# Patient Record
Sex: Male | Born: 1975 | Race: Black or African American | Hispanic: No | Marital: Married | Smoking: Former smoker
Health system: Southern US, Community
[De-identification: ages and names within clinical notes are randomized; demographics above are authoritative.]

## PROBLEM LIST (undated history)

## (undated) DIAGNOSIS — I10 Essential (primary) hypertension: Secondary | ICD-10-CM

## (undated) DIAGNOSIS — I499 Cardiac arrhythmia, unspecified: Secondary | ICD-10-CM

## (undated) DIAGNOSIS — I4891 Unspecified atrial fibrillation: Secondary | ICD-10-CM

---

## 2013-04-02 ENCOUNTER — Emergency Department (HOSPITAL_BASED_OUTPATIENT_CLINIC_OR_DEPARTMENT_OTHER)
Admission: EM | Admit: 2013-04-02 | Discharge: 2013-04-02 | Disposition: A | Discharge status: Home / Self Care | Payer: Managed Care, Other (non HMO) | Attending: Emergency Medicine | Admitting: Emergency Medicine

## 2013-04-02 ENCOUNTER — Encounter (HOSPITAL_BASED_OUTPATIENT_CLINIC_OR_DEPARTMENT_OTHER): Payer: Self-pay | Admitting: Emergency Medicine

## 2013-04-02 ENCOUNTER — Emergency Department (HOSPITAL_BASED_OUTPATIENT_CLINIC_OR_DEPARTMENT_OTHER): Payer: Managed Care, Other (non HMO)

## 2013-04-02 DIAGNOSIS — M25519 Pain in unspecified shoulder: Secondary | ICD-10-CM | POA: Insufficient documentation

## 2013-04-02 DIAGNOSIS — R51 Headache: Secondary | ICD-10-CM | POA: Insufficient documentation

## 2013-04-02 DIAGNOSIS — R079 Chest pain, unspecified: Secondary | ICD-10-CM | POA: Insufficient documentation

## 2013-04-02 DIAGNOSIS — IMO0001 Reserved for inherently not codable concepts without codable children: Secondary | ICD-10-CM | POA: Insufficient documentation

## 2013-04-02 DIAGNOSIS — I10 Essential (primary) hypertension: Secondary | ICD-10-CM | POA: Insufficient documentation

## 2013-04-02 DIAGNOSIS — R0602 Shortness of breath: Secondary | ICD-10-CM | POA: Insufficient documentation

## 2013-04-02 DIAGNOSIS — F17200 Nicotine dependence, unspecified, uncomplicated: Secondary | ICD-10-CM | POA: Insufficient documentation

## 2013-04-02 DIAGNOSIS — R42 Dizziness and giddiness: Secondary | ICD-10-CM | POA: Insufficient documentation

## 2013-04-02 DIAGNOSIS — H9319 Tinnitus, unspecified ear: Secondary | ICD-10-CM | POA: Insufficient documentation

## 2013-04-02 HISTORY — DX: Cardiac arrhythmia, unspecified: I49.9

## 2013-04-02 HISTORY — DX: Essential (primary) hypertension: I10

## 2013-04-02 LAB — CBC WITH DIFFERENTIAL/PLATELET
Basophils Absolute: 0 10*3/uL (ref 0.0–0.1)
Basophils Relative: 0 % (ref 0–1)
EOS PCT: 5 % (ref 0–5)
Eosinophils Absolute: 0.3 10*3/uL (ref 0.0–0.7)
HCT: 44 % (ref 39.0–52.0)
HEMOGLOBIN: 14.5 g/dL (ref 13.0–17.0)
Lymphocytes Relative: 46 % (ref 12–46)
Lymphs Abs: 2.9 10*3/uL (ref 0.7–4.0)
MCH: 27.7 pg (ref 26.0–34.0)
MCHC: 33 g/dL (ref 30.0–36.0)
MCV: 84.1 fL (ref 78.0–100.0)
MONOS PCT: 7 % (ref 3–12)
Monocytes Absolute: 0.4 10*3/uL (ref 0.1–1.0)
Neutro Abs: 2.7 10*3/uL (ref 1.7–7.7)
Neutrophils Relative %: 42 % — ABNORMAL LOW (ref 43–77)
Platelets: 223 10*3/uL (ref 150–400)
RBC: 5.23 MIL/uL (ref 4.22–5.81)
RDW: 15.7 % — ABNORMAL HIGH (ref 11.5–15.5)
WBC: 6.3 10*3/uL (ref 4.0–10.5)

## 2013-04-02 LAB — COMPREHENSIVE METABOLIC PANEL
ALBUMIN: 4.5 g/dL (ref 3.5–5.2)
ALT: 16 U/L (ref 0–53)
AST: 21 U/L (ref 0–37)
Alkaline Phosphatase: 68 U/L (ref 39–117)
BILIRUBIN TOTAL: 0.6 mg/dL (ref 0.3–1.2)
BUN: 11 mg/dL (ref 6–23)
CALCIUM: 10.4 mg/dL (ref 8.4–10.5)
CHLORIDE: 104 meq/L (ref 96–112)
CO2: 27 mEq/L (ref 19–32)
Creatinine, Ser: 1.1 mg/dL (ref 0.50–1.35)
GFR calc Af Amer: >90 mL/min (ref >90)
GFR calc non Af Amer: 83 mL/min — ABNORMAL LOW (ref >90)
Glucose, Bld: 86 mg/dL (ref 70–99)
Potassium: 4.3 mEq/L (ref 3.7–5.3)
Sodium: 144 mEq/L (ref 137–147)
Total Protein: 8.1 g/dL (ref 6.0–8.3)

## 2013-04-02 LAB — D-DIMER, QUANTITATIVE: D-Dimer, Quant: <0.27 ug/mL-FEU (ref 0.00–0.48)

## 2013-04-02 LAB — TROPONIN I: Troponin I: <0.3 ng/mL (ref <0.30)

## 2013-04-02 NOTE — Discharge Instructions (Signed)

## 2013-04-02 NOTE — ED Notes (Signed)
Pt denies medical hx on interview. Wife states he is not being forthcoming. He has a hx of HTN and irregular heartbeat. He was also taking Coumadin but stopped all his medications over a year ago.

## 2013-04-02 NOTE — ED Provider Notes (Signed)
CSN: [id number]     Arrival date & time 04/02/13  1155 History   First MD Initiated Contact with Patient 04/02/13 1427     Chief Complaint  Patient presents with  . Dizziness   (Consider location/radiation/quality/duration/timing/severity/associated sxs/prior Treatment) Patient is a 38 y.o. male presenting with dizziness. The history is provided by the patient.  Dizziness Quality:  Head spinning and imbalance Severity:  Severe Onset quality:  Gradual Duration:  4 days Timing:  Intermittent Progression:  Worsening Chronicity:  New Relieved by:  Lying down Worsened by:  Movement Ineffective treatments:  None tried Associated symptoms: chest pain, headaches, shortness of breath and tinnitus   Associated symptoms: no blood in stool, no diarrhea, no hearing loss, no nausea, no palpitations, no syncope, no vision changes and no vomiting   Risk factors: no anemia, no hx of stroke, no hx of vertigo and no new medications    Aaron Mccoy is a 38 y.o. male who presents to the ED with feeling dizzy x 4 days. He states that he went and had his BP taken yesterday at work and told it was high. He reports that he has had HTN in the past and was on medication but stopped the medication over a year ago. He has had head aches x 4 days as well as chest pain. He has a history of irregular heart beat and has been on blood thinner for heart problems but stopped them a year ago as well. He complains of headache with the dizziness.   Past Medical History  Diagnosis Date  . Hypertension   . Irregular heartbeat    History reviewed. No pertinent past surgical history. No family history on file. History  Substance Use Topics  . Smoking status: Current Every Day Smoker -- 0.50 packs/day    Types: Cigarettes  . Smokeless tobacco: Not on file  . Alcohol Use: No    Review of Systems  Constitutional: Negative for fever and chills.  HENT: Positive for tinnitus. Negative for hearing loss.   Eyes:  Negative for visual disturbance.  Respiratory: Positive for shortness of breath.   Cardiovascular: Positive for chest pain. Negative for palpitations and syncope.  Gastrointestinal: Negative for nausea, vomiting, diarrhea and blood in stool.  Genitourinary: Negative for dysuria and frequency.  Musculoskeletal: Positive for myalgias. Negative for back pain and neck stiffness.       Right shoulder pain  Skin: Negative for rash.  Neurological: Positive for dizziness and headaches. Negative for seizures and weakness.  Psychiatric/Behavioral: Negative for confusion. The patient is not nervous/anxious.     Allergies  Review of patient's allergies indicates no known allergies.  Home Medications  No current outpatient prescriptions on file. BP 136/85  Pulse 75  Temp(Src) 98.2 F (36.8 C) (Oral)  Resp 18  Ht 5' 8"  (1.727 m)  Wt 215 lb (97.523 kg)  BMI 32.70 kg/m2  SpO2 100% Physical Exam  Nursing note and vitals reviewed. Constitutional: He is oriented to person, place, and time. He appears well-developed and well-nourished.  HENT:  Head: Normocephalic and atraumatic.  Right Ear: Tympanic membrane normal.  Left Ear: Tympanic membrane normal.  Mouth/Throat: Oropharynx is clear and moist and mucous membranes are normal.  Eyes: Conjunctivae and EOM are normal.  Neck: Normal range of motion. Neck supple.  Cardiovascular: Normal rate.   Pulmonary/Chest: Effort normal. He has no wheezes. He has no rales.  Abdominal: Soft. There is no tenderness.  Musculoskeletal: Normal range of motion.  Right shoulder: He exhibits tenderness. He exhibits normal range of motion, no swelling, no deformity, no laceration, no spasm, normal pulse and normal strength.       Arms: Neurological: He is alert and oriented to person, place, and time. He has normal strength and normal reflexes. No cranial nerve deficit or sensory deficit. Gait normal.  Skin: Skin is warm and dry.  Psychiatric: He has a normal  mood and affect. His behavior is normal.    ED Course  Procedures Results for orders placed during the hospital encounter of 04/02/13 (from the past 24 hour(s))  COMPREHENSIVE METABOLIC PANEL     Status: Abnormal   Collection Time    04/02/13  3:20 PM      Result Value Range   Sodium 144  137 - 147 mEq/L   Potassium 4.3  3.7 - 5.3 mEq/L   Chloride 104  96 - 112 mEq/L   CO2 27  19 - 32 mEq/L   Glucose, Bld 86  70 - 99 mg/dL   BUN 11  6 - 23 mg/dL   Creatinine, Ser 1.10  0.50 - 1.35 mg/dL   Calcium 10.4  8.4 - 10.5 mg/dL   Total Protein 8.1  6.0 - 8.3 g/dL   Albumin 4.5  3.5 - 5.2 g/dL   AST 21  0 - 37 U/L   ALT 16  0 - 53 U/L   Alkaline Phosphatase 68  39 - 117 U/L   Total Bilirubin 0.6  0.3 - 1.2 mg/dL   GFR calc non Af Amer 83 (*) >90 mL/min   GFR calc Af Amer >90  >90 mL/min  CBC WITH DIFFERENTIAL     Status: Abnormal   Collection Time    04/02/13  3:20 PM      Result Value Range   WBC 6.3  4.0 - 10.5 K/uL   RBC 5.23  4.22 - 5.81 MIL/uL   Hemoglobin 14.5  13.0 - 17.0 g/dL   HCT 44.0  39.0 - 52.0 %   MCV 84.1  78.0 - 100.0 fL   MCH 27.7  26.0 - 34.0 pg   MCHC 33.0  30.0 - 36.0 g/dL   RDW 15.7 (*) 11.5 - 15.5 %   Platelets 223  150 - 400 K/uL   Neutrophils Relative % 42 (*) 43 - 77 %   Neutro Abs 2.7  1.7 - 7.7 K/uL   Lymphocytes Relative 46  12 - 46 %   Lymphs Abs 2.9  0.7 - 4.0 K/uL   Monocytes Relative 7  3 - 12 %   Monocytes Absolute 0.4  0.1 - 1.0 K/uL   Eosinophils Relative 5  0 - 5 %   Eosinophils Absolute 0.3  0.0 - 0.7 K/uL   Basophils Relative 0  0 - 1 %   Basophils Absolute 0.0  0.0 - 0.1 K/uL  TROPONIN I     Status: None   Collection Time    04/02/13  3:20 PM      Result Value Range   Troponin I <0.30  <0.30 ng/mL  D-DIMER, QUANTITATIVE     Status: None   Collection Time    04/02/13  3:20 PM      Result Value Range   D-Dimer, Quant <0.27  0.00 - 0.48 ug/mL-FEU    Imaging Review Dg Chest 2 View  04/02/2013   CLINICAL DATA:  Right-sided chest  pain for 4 days  EXAM: CHEST  2 VIEW  COMPARISON:  None  available  FINDINGS: The heart size and mediastinal contours are within normal limits. Both lungs are clear. The visualized skeletal structures are unremarkable.  IMPRESSION: No active cardiopulmonary disease.   Electronically Signed   By: Skipper Cliche M.D.   On: 04/02/2013 15:12    EKG Interpretation    Date/Time:  Thursday April 02 2013 12:19:56 EST Ventricular Rate:  77 PR Interval:  160 QRS Duration: 82 QT Interval:  342 QTC Calculation: 387 R Axis:   69 Text Interpretation:  Normal sinus rhythm with sinus arrhythmia Nonspecific T wave abnormality Abnormal ECG No previous ECGs available Confirmed by Wyvonnia Dusky  MD, STEPHEN ([id number]) on 04/02/2013 12:22:42 PM            MDM: Dr. Wyvonnia Dusky in to examine the patient and review labs and x-ray.   38 y.o. male with complaint of dizziness and right shoulder pain.I have reviewed this patient's vital signs, nurses notes, appropriate labs and imaging.  I have discussed findings with the patient and plan of care. He voices understanding. He has an appointment with his PCP. He will keep this appointment. He will return here for worsening symptoms. Stable for discharge without any immediate complications. No signs of PE, MI or CVA at this time.   [place], [place] 04/03/13 1446

## 2013-04-02 NOTE — ED Notes (Signed)
Lightheaded and dizzy x 4 days. Right shoulder pain. States he has a hx of tendinitis.

## 2013-04-03 NOTE — ED Provider Notes (Signed)
Medical screening examination/treatment/procedure(s) were conducted as a shared visit with non-physician practitioner(s) and myself.  I personally evaluated the patient during the encounter.  Poor historian regarding current symptoms as well as PMH. 4 day history of vague lightheadedness, fatigue, intermittent headaches, and R sided chest pain. Hx HTN and possible atrial fibrillation, stopped all meds 1 year ago.  Was on xarelto and coumadin previously. CN 2-12 intact, no ataxia on finger to nose, no nystagmus, 5/5 strength throughout, no pronator drift, Romberg negative, normal gait. NSR on EKG, nonfocal neuro exam, no headache currently. BP mildly elevated. BP 148/92  Pulse 79  Temp(Src) 98.3 F (36.8 C) (Oral)  Resp 20  Ht 5' 8"  (1.727 m)  Wt 215 lb (97.523 kg)  BMI 32.70 kg/m2  SpO2 99%    EKG Interpretation    Date/Time:  Thursday April 02 2013 12:19:56 EST Ventricular Rate:  77 PR Interval:  160 QRS Duration: 82 QT Interval:  342 QTC Calculation: 387 R Axis:   69 Text Interpretation:  Normal sinus rhythm with sinus arrhythmia Nonspecific T wave abnormality Abnormal ECG No previous ECGs available Confirmed by Wyvonnia Dusky  MD, [place] ([id number]) on 04/02/2013 12:22:42 PM              Ezequiel Essex, MD 04/03/13 2355

## 2014-03-27 ENCOUNTER — Emergency Department (HOSPITAL_COMMUNITY): Payer: No Typology Code available for payment source

## 2014-03-27 ENCOUNTER — Emergency Department (HOSPITAL_COMMUNITY)
Admission: EM | Admit: 2014-03-27 | Discharge: 2014-03-27 | Disposition: A | Discharge status: Home / Self Care | Payer: No Typology Code available for payment source | Attending: Emergency Medicine | Admitting: Emergency Medicine

## 2014-03-27 ENCOUNTER — Encounter (HOSPITAL_COMMUNITY): Payer: Self-pay | Admitting: Emergency Medicine

## 2014-03-27 DIAGNOSIS — Y92410 Unspecified street and highway as the place of occurrence of the external cause: Secondary | ICD-10-CM | POA: Diagnosis not present

## 2014-03-27 DIAGNOSIS — Y998 Other external cause status: Secondary | ICD-10-CM | POA: Insufficient documentation

## 2014-03-27 DIAGNOSIS — Y9389 Activity, other specified: Secondary | ICD-10-CM | POA: Insufficient documentation

## 2014-03-27 DIAGNOSIS — Z720 Tobacco use: Secondary | ICD-10-CM | POA: Insufficient documentation

## 2014-03-27 DIAGNOSIS — T148XXA Other injury of unspecified body region, initial encounter: Secondary | ICD-10-CM

## 2014-03-27 DIAGNOSIS — S8992XA Unspecified injury of left lower leg, initial encounter: Secondary | ICD-10-CM | POA: Diagnosis present

## 2014-03-27 DIAGNOSIS — S80212A Abrasion, left knee, initial encounter: Secondary | ICD-10-CM | POA: Insufficient documentation

## 2014-03-27 DIAGNOSIS — I10 Essential (primary) hypertension: Secondary | ICD-10-CM | POA: Insufficient documentation

## 2014-03-27 DIAGNOSIS — M25562 Pain in left knee: Secondary | ICD-10-CM

## 2014-03-27 MED ORDER — NAPROXEN 500 MG PO TABS: 500.0000 mg | ORAL_TABLET | Freq: Two times a day (BID) | ORAL | Status: DC
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
## 2014-09-23 ENCOUNTER — Emergency Department (HOSPITAL_COMMUNITY)
Admission: EM | Admit: 2014-09-23 | Discharge: 2014-09-23 | Disposition: A | Discharge status: Home / Self Care | Payer: Managed Care, Other (non HMO) | Attending: Emergency Medicine | Admitting: Emergency Medicine

## 2014-09-23 ENCOUNTER — Encounter (HOSPITAL_COMMUNITY): Payer: Self-pay | Admitting: Emergency Medicine

## 2014-09-23 DIAGNOSIS — Z8679 Personal history of other diseases of the circulatory system: Secondary | ICD-10-CM | POA: Insufficient documentation

## 2014-09-23 DIAGNOSIS — Z720 Tobacco use: Secondary | ICD-10-CM | POA: Insufficient documentation

## 2014-09-23 DIAGNOSIS — I10 Essential (primary) hypertension: Secondary | ICD-10-CM | POA: Insufficient documentation

## 2014-09-23 DIAGNOSIS — Y9389 Activity, other specified: Secondary | ICD-10-CM | POA: Insufficient documentation

## 2014-09-23 DIAGNOSIS — M545 Low back pain, unspecified: Secondary | ICD-10-CM

## 2014-09-23 DIAGNOSIS — S3992XA Unspecified injury of lower back, initial encounter: Secondary | ICD-10-CM | POA: Insufficient documentation

## 2014-09-23 DIAGNOSIS — Y9289 Other specified places as the place of occurrence of the external cause: Secondary | ICD-10-CM | POA: Insufficient documentation

## 2014-09-23 DIAGNOSIS — W01198A Fall on same level from slipping, tripping and stumbling with subsequent striking against other object, initial encounter: Secondary | ICD-10-CM | POA: Insufficient documentation

## 2014-09-23 DIAGNOSIS — Y998 Other external cause status: Secondary | ICD-10-CM | POA: Insufficient documentation

## 2014-09-23 HISTORY — DX: Unspecified atrial fibrillation: I48.91

## 2014-09-23 MED ORDER — NAPROXEN 250 MG PO TABS: 250.0000 mg | ORAL_TABLET | Freq: Two times a day (BID) | ORAL | Status: DC

## 2014-09-23 MED ORDER — METHOCARBAMOL 500 MG PO TABS: 500.0000 mg | ORAL_TABLET | Freq: Two times a day (BID) | ORAL | Status: DC | PRN

## 2014-09-23 NOTE — ED Provider Notes (Signed)
CSN: [id number]     Arrival date & time 09/23/14  0814 History  This chart was scribed for non-physician practitioner, Waynetta Pean, PA-C, working with Nat Christen, MD by Ladene Artist, ED Scribe. This patient was seen in room TR07C/TR07C and the patient's care was started at 9:05 AM.   Chief Complaint  Patient presents with  . Fall   The history is provided by the patient. No language interpreter was used.   HPI Comments: Aaron Mccoy is a 39 y.o. male who presents to the Emergency Department complaining of a slip and fall that occurred 5 days ago. Pt states that he slipped on wet wooden steps 5 days ago and has experienced persistent, 10/10 left lower back pain since. No head trauma or LOC.  Pt reports that pain is worse with movement. He has been treating with ibuprofen without significant relief; last dose was last night. Pt denies numbness/tingling, weakness, urinary or bowel incontinence, abdominal pain, nausea, vomiting, dysuria, hematuria, chest pain, SOB. He also denies h/o back pain, CA or IV drug use.  PCP: Jeralene Huff   Past Medical History  Diagnosis Date  . Hypertension   . Irregular heartbeat   . A-fib    History reviewed. No pertinent past surgical history. No family history on file. History  Substance Use Topics  . Smoking status: Current Every Day Smoker -- 1.00 packs/day    Types: Cigarettes  . Smokeless tobacco: Not on file  . Alcohol Use: No    Review of Systems  Constitutional: Negative for fever and chills.  Respiratory: Negative for shortness of breath.   Cardiovascular: Negative for chest pain.  Gastrointestinal: Negative for nausea, vomiting and abdominal pain.  Genitourinary: Negative for dysuria, hematuria and difficulty urinating.  Musculoskeletal: Positive for back pain. Negative for gait problem, neck pain and neck stiffness.  Skin: Negative for rash and wound.  Neurological: Negative for weakness, light-headedness and numbness.   Allergies   Review of patient's allergies indicates no known allergies.  Home Medications   Prior to Admission medications   Medication Sig Start Date End Date Taking? Authorizing Provider  methocarbamol (ROBAXIN) 500 MG tablet Take 1 tablet (500 mg total) by mouth 2 (two) times daily as needed for muscle spasms. 09/23/14   Waynetta Pean, PA-C  naproxen (NAPROSYN) 250 MG tablet Take 1 tablet (250 mg total) by mouth 2 (two) times daily with a meal. 09/23/14   Waynetta Pean, PA-C   BP 141/97 mmHg  Pulse 75  Temp(Src) 98.4 F (36.9 C) (Oral)  Resp 16  SpO2 100% Physical Exam  Constitutional: He is oriented to person, place, and time. He appears well-developed and well-nourished. No distress.  Nontoxic appearing.  HENT:  Head: Normocephalic and atraumatic.  Eyes: Right eye exhibits no discharge. Left eye exhibits no discharge.  Neck: Normal range of motion. Neck supple.  Cardiovascular: Normal rate, regular rhythm and intact distal pulses.   Pulses:      Dorsalis pedis pulses are 2+ on the right side, and 2+ on the left side.       Posterior tibial pulses are 2+ on the right side, and 2+ on the left side.  Pulmonary/Chest: Effort normal. No respiratory distress.  Musculoskeletal: Normal range of motion. He exhibits tenderness. He exhibits no edema.  Mild L lateral lower back tenderness to palpation. No evidence of trauma. No midline spine tenderness. No back erythema, echymosis, deformity or edema. No LE edema or tenderness. Sensation intact in bilateral LE. The patient is able  to ambulate without difficulty or assistance. Patient has 5/5 strength in his bilateral lower extremities.  Neurological: He is alert and oriented to person, place, and time. He has normal reflexes. He displays normal reflexes. Coordination normal.  Sensation is intact his bilateral lower extremities. Bilateral patellar DTRs are intact.  Skin: Skin is warm and dry. No rash noted. He is not diaphoretic. No erythema. No pallor.   Psychiatric: He has a normal mood and affect. His behavior is normal.  Nursing note and vitals reviewed.  ED Course  Procedures (including critical care time) DIAGNOSTIC STUDIES: Oxygen Saturation is 100% on RA, normal by my interpretation.    COORDINATION OF CARE: 9:11 AM-Discussed treatment plan which includes Naproxen with pt at bedside and pt agreed to plan.   Labs Review Labs Reviewed - No data to display  Imaging Review No results found.   EKG Interpretation None      Filed Vitals:   09/23/14 0904  BP: 141/97  Pulse: 75  Temp: 98.4 F (36.9 C)  TempSrc: Oral  Resp: 16  SpO2: 100%     MDM   Meds given in ED:  Medications - No data to display  New Prescriptions   METHOCARBAMOL (ROBAXIN) 500 MG TABLET    Take 1 tablet (500 mg total) by mouth 2 (two) times daily as needed for muscle spasms.   NAPROXEN (NAPROSYN) 250 MG TABLET    Take 1 tablet (250 mg total) by mouth 2 (two) times daily with a meal.    Final diagnoses:  Left-sided low back pain without sciatica   Patient with left back pain after a trip and fall 5 days ago.  No neurological deficits and normal neuro exam.  Patient can walk but states is painful.  No loss of bowel or bladder control.  No concern for cauda equina.  No fever, night sweats, weight loss, h/o cancer, IVDU.  RICE protocol and pain medicine indicated and discussed with patient. I advised the patient to follow-up with their primary care provider this week. I advised the patient to return to the emergency department with new or worsening symptoms or new concerns. The patient verbalized understanding and agreement with plan.    I personally performed the services described in this documentation, which was scribed in my presence. The recorded information has been reviewed and is accurate.    Waynetta Pean, PA-C 09/23/14 2820  Nat Christen, MD 09/24/14 1344

## 2014-09-23 NOTE — Discharge Instructions (Signed)
Back Exercises Back exercises help treat and prevent back injuries. The goal of back exercises is to increase the strength of your abdominal and back muscles and the flexibility of your back. These exercises should be started when you no longer have back pain. Back exercises include:  Pelvic Tilt. Lie on your back with your knees bent. Tilt your pelvis until the lower part of your back is against the floor. Hold this position 5 to 10 sec and repeat 5 to 10 times.  Knee to Chest. Pull first 1 knee up against your chest and hold for 20 to 30 seconds, repeat this with the other knee, and then both knees. This may be done with the other leg straight or bent, whichever feels better.  Sit-Ups or Curl-Ups. Bend your knees 90 degrees. Start with tilting your pelvis, and do a partial, slow sit-up, lifting your trunk only 30 to 45 degrees off the floor. Take at least 2 to 3 seconds for each sit-up. Do not do sit-ups with your knees out straight. If partial sit-ups are difficult, simply do the above but with only tightening your abdominal muscles and holding it as directed.  Hip-Lift. Lie on your back with your knees flexed 90 degrees. Push down with your feet and shoulders as you raise your hips a couple inches off the floor; hold for 10 seconds, repeat 5 to 10 times.  Back arches. Lie on your stomach, propping yourself up on bent elbows. Slowly press on your hands, causing an arch in your low back. Repeat 3 to 5 times. Any initial stiffness and discomfort should lessen with repetition over time.  Shoulder-Lifts. Lie face down with arms beside your body. Keep hips and torso pressed to floor as you slowly lift your head and shoulders off the floor. Do not overdo your exercises, especially in the beginning. Exercises may cause you some mild back discomfort which lasts for a few minutes; however, if the pain is more severe, or lasts for more than 15 minutes, do not continue exercises until you see your caregiver.  Improvement with exercise therapy for back problems is slow.  See your caregivers for assistance with developing a proper back exercise program. Document Released: 03/29/2004 Document Revised: 05/14/2011 Document Reviewed: 12/21/2010 [facility] Patient Information 2015 [place], [place]. This information is not intended to replace advice given to you by your health care provider. Make sure you discuss any questions you have with your health care provider. Back Pain, Adult Low back pain is very common. About 1 in 5 people have back pain.The cause of low back pain is rarely dangerous. The pain often gets better over time.About half of people with a sudden onset of back pain feel better in just 2 weeks. About 8 in 10 people feel better by 6 weeks.  CAUSES Some common causes of back pain include:  Strain of the muscles or ligaments supporting the spine.  Wear and tear (degeneration) of the spinal discs.  Arthritis.  Direct injury to the back. DIAGNOSIS Most of the time, the direct cause of low back pain is not known.However, back pain can be treated effectively even when the exact cause of the pain is unknown.Answering your caregiver's questions about your overall health and symptoms is one of the most accurate ways to make sure the cause of your pain is not dangerous. If your caregiver needs more information, he or she may order lab work or imaging tests (X-rays or MRIs).However, even if imaging tests show changes in your back,  this usually does not require surgery. HOME CARE INSTRUCTIONS For many people, back pain returns.Since low back pain is rarely dangerous, it is often a condition that people can learn to [facility] their own.   Remain active. It is stressful on the back to sit or stand in one place. Do not sit, drive, or stand in one place for more than 30 minutes at a time. Take short walks on level surfaces as soon as pain allows.Try to increase the length of time you walk each  day.  Do not stay in bed.Resting more than 1 or 2 days can delay your recovery.  Do not avoid exercise or work.Your body is made to move.It is not dangerous to be active, even though your back may hurt.Your back will likely heal faster if you return to being active before your pain is gone.  Pay attention to your body when you bend and lift. Many people have less discomfortwhen lifting if they bend their knees, keep the load close to their bodies,and avoid twisting. Often, the most comfortable positions are those that put less stress on your recovering back.  Find a comfortable position to sleep. Use a firm mattress and lie on your side with your knees slightly bent. If you lie on your back, put a pillow under your knees.  Only take over-the-counter or prescription medicines as directed by your caregiver. Over-the-counter medicines to reduce pain and inflammation are often the most helpful.Your caregiver may prescribe muscle relaxant drugs.These medicines help dull your pain so you can more quickly return to your normal activities and healthy exercise.  Put ice on the injured area.  Put ice in a plastic bag.  Place a towel between your skin and the bag.  Leave the ice on for 15-20 minutes, 03-04 times a day for the first 2 to 3 days. After that, ice and heat may be alternated to reduce pain and spasms.  Ask your caregiver about trying back exercises and gentle massage. This may be of some benefit.  Avoid feeling anxious or stressed.Stress increases muscle tension and can worsen back pain.It is important to recognize when you are anxious or stressed and learn ways to manage it.Exercise is a great option. SEEK MEDICAL CARE IF:  You have pain that is not relieved with rest or medicine.  You have pain that does not improve in 1 week.  You have new symptoms.  You are generally not feeling well. SEEK IMMEDIATE MEDICAL CARE IF:   You have pain that radiates from your back into  your legs.  You develop new bowel or bladder control problems.  You have unusual weakness or numbness in your arms or legs.  You develop nausea or vomiting.  You develop abdominal pain.  You feel faint. Document Released: 02/19/2005 Document Revised: 08/21/2011 Document Reviewed: 06/23/2013 [facility] Patient Information 2015 [place], [place]. This information is not intended to replace advice given to you by your health care provider. Make sure you discuss any questions you have with your health care provider.

## 2014-09-23 NOTE — ED Notes (Signed)
Declined W/C at D/C and was escorted to lobby by RN. 

## 2014-09-23 NOTE — ED Notes (Signed)
Patient states slipped on some wood steps on Saturday and now having low back pain.  Patient states has been using ibuprofen and ben gay since the fall, but still having pain.

## 2015-02-23 ENCOUNTER — Encounter (HOSPITAL_COMMUNITY): Payer: Self-pay | Admitting: *Deleted

## 2015-02-23 ENCOUNTER — Emergency Department (HOSPITAL_COMMUNITY)
Admission: EM | Admit: 2015-02-23 | Discharge: 2015-02-23 | Disposition: A | Discharge status: Home / Self Care | Payer: Managed Care, Other (non HMO) | Attending: Emergency Medicine | Admitting: Emergency Medicine

## 2015-02-23 DIAGNOSIS — J111 Influenza due to unidentified influenza virus with other respiratory manifestations: Secondary | ICD-10-CM

## 2015-02-23 DIAGNOSIS — Z791 Long term (current) use of non-steroidal anti-inflammatories (NSAID): Secondary | ICD-10-CM | POA: Insufficient documentation

## 2015-02-23 DIAGNOSIS — F17210 Nicotine dependence, cigarettes, uncomplicated: Secondary | ICD-10-CM | POA: Diagnosis not present

## 2015-02-23 DIAGNOSIS — I10 Essential (primary) hypertension: Secondary | ICD-10-CM | POA: Insufficient documentation

## 2015-02-23 DIAGNOSIS — R5383 Other fatigue: Secondary | ICD-10-CM | POA: Diagnosis present

## 2015-02-23 DIAGNOSIS — R69 Illness, unspecified: Secondary | ICD-10-CM

## 2015-02-23 MED ORDER — BENZONATATE 100 MG PO CAPS: 100.0000 mg | ORAL_CAPSULE | Freq: Three times a day (TID) | ORAL | Status: DC

## 2015-02-23 MED ORDER — IBUPROFEN 800 MG PO TABS: 800.0000 mg | ORAL_TABLET | Freq: Once | ORAL | Status: DC

## 2015-02-23 MED ORDER — ACETAMINOPHEN 500 MG PO TABS: 1000.0000 mg | ORAL_TABLET | Freq: Once | ORAL | Status: DC

## 2015-02-23 NOTE — ED Notes (Signed)
Pt arrives from home with c/o fatigue and generalized body aches. Pt denies cough, fever or chills.

## 2015-02-23 NOTE — ED Provider Notes (Signed)
CSN: [id number]     Arrival date & time 02/23/15  0913 History   First MD Initiated Contact with Patient 02/23/15 0915     Chief Complaint  Patient presents with  . Fatigue     (Consider location/radiation/quality/duration/timing/severity/associated sxs/prior Treatment) Patient is a 39 y.o. male presenting with general illness. The history is provided by the patient.  Illness Severity:  Moderate Onset quality:  Gradual Duration:  2 days Timing:  Constant Progression:  Worsening Chronicity:  New Associated symptoms: congestion, cough, fatigue, fever and myalgias   Associated symptoms: no abdominal pain, no chest pain, no diarrhea, no headaches, no rash, no shortness of breath and no vomiting     39 yo M with a chief complaint of cough congestion subjective fevers and chills. This been going on for the past couple days. Patient is felt really weak at home today. Denies shortness breath denies chest pain denies abdominal pain nausea vomiting or diarrhea. Coworkers have been sick.  Past Medical History  Diagnosis Date  . Hypertension   . Irregular heartbeat   . A-fib [facility])    History reviewed. No pertinent past surgical history. History reviewed. No pertinent family history. Social History  Substance Use Topics  . Smoking status: Current Every Day Smoker -- 1.00 packs/day    Types: Cigarettes  . Smokeless tobacco: None  . Alcohol Use: No    Review of Systems  Constitutional: Positive for fever and fatigue. Negative for chills.  HENT: Positive for congestion. Negative for facial swelling.   Eyes: Negative for discharge and visual disturbance.  Respiratory: Positive for cough. Negative for shortness of breath.   Cardiovascular: Negative for chest pain and palpitations.  Gastrointestinal: Negative for vomiting, abdominal pain and diarrhea.  Musculoskeletal: Positive for myalgias. Negative for arthralgias.  Skin: Negative for color change and rash.  Neurological: Negative  for tremors, syncope and headaches.  Psychiatric/Behavioral: Negative for confusion and dysphoric mood.      Allergies  Review of patient's allergies indicates no known allergies.  Home Medications   Prior to Admission medications   Medication Sig Start Date End Date Taking? Authorizing Provider  naproxen (NAPROSYN) 250 MG tablet Take 1 tablet (250 mg total) by mouth 2 (two) times daily with a meal. 09/23/14  Yes Waynetta Pean, PA-C  benzonatate (TESSALON) 100 MG capsule Take 1 capsule (100 mg total) by mouth every 8 (eight) hours. 02/23/15   Deno Etienne, DO  methocarbamol (ROBAXIN) 500 MG tablet Take 1 tablet (500 mg total) by mouth 2 (two) times daily as needed for muscle spasms. 09/23/14   Waynetta Pean, PA-C   BP 140/102 mmHg  Pulse 79  Temp(Src) 98.9 F (37.2 C) (Oral)  Resp 18  SpO2 99% Physical Exam  Constitutional: He is oriented to person, place, and time. He appears well-developed and well-nourished.  HENT:  Head: Normocephalic and atraumatic.  Swollen turbinates  Eyes: EOM are normal. Pupils are equal, round, and reactive to light.  Neck: Normal range of motion. Neck supple. No JVD present.  Cardiovascular: Normal rate and regular rhythm.  Exam reveals no gallop and no friction rub.   No murmur heard. Pulmonary/Chest: No respiratory distress. He has no wheezes. He has no rales.  Abdominal: He exhibits no distension. There is no rebound and no guarding.  Musculoskeletal: Normal range of motion.  Neurological: He is alert and oriented to person, place, and time.  Skin: No rash noted. No pallor.  Psychiatric: He has a normal mood and affect. His behavior is  normal.  Nursing note and vitals reviewed.   ED Course  Procedures (including critical care time) Labs Review Labs Reviewed - No data to display  Imaging Review No results found. I have personally reviewed and evaluated these images and lab results as part of my medical decision-making.   EKG  Interpretation None      MDM   Final diagnoses:  Influenza-like illness    39 yo M with a viral-like syndrome. Patient is well-appearing and nontoxic clear breath sounds in all lung fields feel no need for x-ray at this time. Will start on NSAIDs Tylenol he can follow-up with his family doctor.  9:33 AM:  I have discussed the diagnosis/risks/treatment options with the patient and believe the pt to be eligible for discharge home to follow-up with PCP. We also discussed returning to the ED immediately if new or worsening sx occur. We discussed the sx which are most concerning (e.g., sudden worsening pain, fever, inability to tolerate by mouth) that necessitate immediate return. Medications administered to the patient during their visit and any new prescriptions provided to the patient are listed below.  Medications given during this visit Medications  acetaminophen (TYLENOL) tablet 1,000 mg (not administered)  ibuprofen (ADVIL,MOTRIN) tablet 800 mg (not administered)    New Prescriptions   BENZONATATE (TESSALON) 100 MG CAPSULE    Take 1 capsule (100 mg total) by mouth every 8 (eight) hours.    The patient appears reasonably screen and/or stabilized for discharge and I doubt any other medical condition or other [facility] requiring further screening, evaluation, or treatment in the ED at this time prior to discharge.      Deno Etienne, DO 02/23/15 [phone]

## 2015-02-23 NOTE — Discharge Instructions (Signed)
Take tylenol 2 pills 4 times a day and motrin 4 pills 3 times a day.  Drink plenty of fluids.  Return for worsening shortness of breath, headache, confusion. Follow up with your family doctor.   Influenza, Adult Influenza (flu) is an infection in the mouth, nose, and throat (respiratory tract) caused by a virus. The flu can make you feel very ill. Influenza spreads easily from person to person (contagious).  HOME CARE   Only take medicines as told by your doctor.  Use a cool mist humidifier to make breathing easier.  Get plenty of rest until your fever goes away. This usually takes 3 to 4 days.  Drink enough fluids to keep your pee (urine) clear or pale yellow.  Cover your mouth and nose when you cough or sneeze.  Wash your hands well to avoid spreading the flu.  Stay home from work or school until your fever has been gone for at least 1 full day.  Get a flu shot every year. GET HELP RIGHT AWAY IF:   You have trouble breathing or feel short of breath.  Your skin or nails turn blue.  You have severe neck pain or stiffness.  You have a severe headache, facial pain, or earache.  Your fever gets worse or keeps coming back.  You feel sick to your stomach (nauseous), throw up (vomit), or have watery poop (diarrhea).  You have chest pain.  You have a deep cough that gets worse, or you cough up more thick spit (mucus). MAKE SURE YOU:   Understand these instructions.  Will watch your condition.  Will get help right away if you are not doing well or get worse.   This information is not intended to replace advice given to you by your health care provider. Make sure you discuss any questions you have with your health care provider.   Document Released: 11/29/2007 Document Revised: 03/12/2014 Document Reviewed: 05/21/2011 Elsevier Interactive Patient Education [employer].

## 2015-02-23 NOTE — ED Notes (Signed)
Pt is in stable condition upon d/c and ambulates from ED. 

## 2015-04-08 ENCOUNTER — Emergency Department (HOSPITAL_COMMUNITY)
Admission: EM | Admit: 2015-04-08 | Discharge: 2015-04-08 | Disposition: A | Discharge status: Home / Self Care | Payer: Managed Care, Other (non HMO) | Attending: Emergency Medicine | Admitting: Emergency Medicine

## 2015-04-08 ENCOUNTER — Encounter (HOSPITAL_COMMUNITY): Payer: Self-pay | Admitting: Emergency Medicine

## 2015-04-08 DIAGNOSIS — S6991XA Unspecified injury of right wrist, hand and finger(s), initial encounter: Secondary | ICD-10-CM | POA: Diagnosis present

## 2015-04-08 DIAGNOSIS — F17210 Nicotine dependence, cigarettes, uncomplicated: Secondary | ICD-10-CM | POA: Diagnosis not present

## 2015-04-08 DIAGNOSIS — S61411A Laceration without foreign body of right hand, initial encounter: Secondary | ICD-10-CM

## 2015-04-08 DIAGNOSIS — I10 Essential (primary) hypertension: Secondary | ICD-10-CM | POA: Diagnosis not present

## 2015-04-08 DIAGNOSIS — Z23 Encounter for immunization: Secondary | ICD-10-CM | POA: Insufficient documentation

## 2015-04-08 DIAGNOSIS — Y9289 Other specified places as the place of occurrence of the external cause: Secondary | ICD-10-CM | POA: Diagnosis not present

## 2015-04-08 DIAGNOSIS — R000 Tachycardia, unspecified: Secondary | ICD-10-CM | POA: Diagnosis not present

## 2015-04-08 DIAGNOSIS — Y9389 Activity, other specified: Secondary | ICD-10-CM | POA: Insufficient documentation

## 2015-04-08 DIAGNOSIS — Y998 Other external cause status: Secondary | ICD-10-CM | POA: Diagnosis not present

## 2015-04-08 MED ORDER — HYDROCODONE-ACETAMINOPHEN 5-325 MG PO TABS
2.0000 | ORAL_TABLET | Freq: Once | ORAL | Status: AC
  Administered 2015-04-08: 2 via ORAL
  Filled 2015-04-08: qty 2

## 2015-04-08 MED ORDER — TETANUS-DIPHTH-ACELL PERTUSSIS 5-2.5-18.5 LF-MCG/0.5 IM SUSP
0.5000 mL | Freq: Once | INTRAMUSCULAR | Status: AC
Start: 2015-04-08 — End: 2015-04-08
  Administered 2015-04-08: 0.5 mL via INTRAMUSCULAR
  Filled 2015-04-08: qty 0.5

## 2015-04-08 MED ORDER — LIDOCAINE-EPINEPHRINE (PF) 2 %-1:200000 IJ SOLN
20.0000 mL | Freq: Once | INTRAMUSCULAR | Status: AC
  Administered 2015-04-08: 20 mL via INTRADERMAL
  Filled 2015-04-08: qty 20

## 2015-04-08 MED ORDER — FENTANYL CITRATE (PF) 100 MCG/2ML IJ SOLN
50.0000 ug | Freq: Once | INTRAMUSCULAR | Status: DC
Start: 2015-04-08 — End: 2015-04-08
  Filled 2015-04-08: qty 2

## 2015-04-08 MED ORDER — BACITRACIN ZINC 500 UNIT/GM EX OINT
1.0000 "application " | TOPICAL_OINTMENT | Freq: Two times a day (BID) | CUTANEOUS | Status: DC
  Administered 2015-04-08: 1 via TOPICAL
  Filled 2015-04-08: qty 0.9

## 2015-04-08 NOTE — ED Notes (Signed)
Ortho called to do splint
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
## 2015-04-08 NOTE — ED Notes (Signed)
Ortho tech at bedside
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
## 2015-04-08 NOTE — Discharge Instructions (Signed)
Sutured Wound Care Sutures are stitches that can be used to close wounds. Taking care of your wound properly can help to prevent pain and infection. It can also help your wound to heal more quickly. HOW TO CARE FOR YOUR SUTURED WOUND Wound Care  Keep the wound clean and dry.  If you were given a bandage (dressing), you should change it at least once per day or as directed by your health care provider. You should also change it if it becomes wet or dirty.  Keep the wound completely dry for the first 24 hours or as directed by your health care provider. After that time, you may shower or bathe. However, make sure that the wound is not soaked in water until the sutures have been removed.  Clean the wound one time each day or as directed by your health care provider.  Wash the wound with soap and water.  Rinse the wound with water to remove all soap.  Pat the wound dry with a clean towel. Do not rub the wound.  Aftercleaning the wound, apply a thin layer of antibioticointment as directed by your health care provider. This will help to prevent infection and keep the dressing from sticking to the wound.  Have the sutures removed as directed by your health care provider. General Instructions  Take or apply medicines only as directed by your health care provider.  To help prevent scarring, make sure to cover your wound with sunscreen whenever you are outside after the sutures are removed and the wound is healed. Make sure to wear a sunscreen of at least 30 SPF.  If you were prescribed an antibiotic medicine or ointment, finish all of it even if you start to feel better.  Do not scratch or pick at the wound.  Keep all follow-up visits as directed by your health care provider. This is important.  Check your wound every day for signs of infection. Watch for:   Redness, swelling, or pain.  Fluid, blood, or pus.  Raise (elevate) the injured area above the level of your heart while you  are sitting or lying down, if possible.  Avoid stretching your wound.  Drink enough fluids to keep your urine clear or pale yellow. SEEK MEDICAL CARE IF:  You received a tetanus shot and you have swelling, severe pain, redness, or bleeding at the injection site.  You have a fever.  A wound that was closed breaks open.  You notice a bad smell coming from the wound.  You notice something coming out of the wound, such as wood or glass.  Your pain is not controlled with medicine.  You have increased redness, swelling, or pain at the site of your wound.  You have fluid, blood, or pus coming from your wound.  You notice a change in the color of your skin near your wound.  You need to change the dressing frequently due to fluid, blood, or pus draining from the wound.  You develop a new rash.  You develop numbness around the wound. SEEK IMMEDIATE MEDICAL CARE IF:  You develop severe swelling around the injury site.  Your pain suddenly increases and is severe.  You develop painful lumps near the wound or on skin that is anywhere on your body.  You have a red streak going away from your wound.  The wound is on your hand or foot and you cannot properly move a finger or toe.  The wound is on your hand or foot and  you notice that your fingers or toes look pale or bluish.   This information is not intended to replace advice given to you by your health care provider. Make sure you discuss any questions you have with your health care provider.   Document Released: 03/29/2004 Document Revised: 07/06/2014 Document Reviewed: 10/01/2012 Elsevier Interactive Patient Education 2016 [place] Taking care of your wound properly can help to prevent pain and infection. It can also help your wound to heal more quickly.  HOW TO CARE FOR YOUR WOUND  Take or apply over-the-counter and prescription medicines only as told by your health care provider.  If you were prescribed  antibiotic medicine, take or apply it as told by your health care provider. Do not stop using the antibiotic even if your condition improves.  Clean the wound each day or as told by your health care provider.  Wash the wound with mild soap and water.  Rinse the wound with water to remove all soap.  Pat the wound dry with a clean towel. Do not rub it.  There are many different ways to close and cover a wound. For example, a wound can be covered with stitches (sutures), skin glue, or adhesive strips. Follow instructions from your health care provider about:  How to take care of your wound.  When and how you should change your bandage (dressing).  When you should remove your dressing.  Removing whatever was used to close your wound.  Check your wound every day for signs of infection. Watch for:  Redness, swelling, or pain.  Fluid, blood, or pus.  Keep the dressing dry until your health care provider says it can be removed. Do not take baths, swim, use a hot tub, or do anything that would put your wound underwater until your health care provider approves.  Raise (elevate) the injured area above the level of your heart while you are sitting or lying down.  Do not scratch or pick at the wound.  Keep all follow-up visits as told by your health care provider. This is important. SEEK MEDICAL CARE IF:  You received a tetanus shot and you have swelling, severe pain, redness, or bleeding at the injection site.  You have a fever.  Your pain is not controlled with medicine.  You have increased redness, swelling, or pain at the site of your wound.  You have fluid, blood, or pus coming from your wound.  You notice a bad smell coming from your wound or your dressing. SEEK IMMEDIATE MEDICAL CARE IF:  You have a red streak going away from your wound.   This information is not intended to replace advice given to you by your health care provider. Make sure you discuss any questions you  have with your health care provider.   Document Released: 11/29/2007 Document Revised: 07/06/2014 Document Reviewed: 02/15/2014 Elsevier Interactive Patient Education [employer].

## 2015-04-18 ENCOUNTER — Emergency Department (HOSPITAL_COMMUNITY)
Admission: EM | Admit: 2015-04-18 | Discharge: 2015-04-18 | Disposition: A | Discharge status: Home / Self Care | Payer: Managed Care, Other (non HMO) | Attending: Emergency Medicine | Admitting: Emergency Medicine

## 2015-04-18 ENCOUNTER — Encounter (HOSPITAL_COMMUNITY): Payer: Self-pay | Admitting: *Deleted

## 2015-04-18 DIAGNOSIS — Z4802 Encounter for removal of sutures: Secondary | ICD-10-CM | POA: Insufficient documentation

## 2015-04-18 DIAGNOSIS — F17210 Nicotine dependence, cigarettes, uncomplicated: Secondary | ICD-10-CM | POA: Insufficient documentation

## 2015-04-18 DIAGNOSIS — I10 Essential (primary) hypertension: Secondary | ICD-10-CM

## 2015-04-18 MED ORDER — HYDROCHLOROTHIAZIDE 25 MG PO TABS: 25.0000 mg | ORAL_TABLET | Freq: Every day | ORAL | Status: DC
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
## 2015-04-18 NOTE — Discharge Instructions (Signed)
Managing Your High Blood Pressure Blood pressure is a measurement of how forceful your blood is pressing against the walls of the arteries. Arteries are muscular tubes within the circulatory system. Blood pressure does not stay the same. Blood pressure rises when you are active, excited, or nervous; and it lowers during sleep and relaxation. If the numbers measuring your blood pressure stay above normal most of the time, you are at risk for health problems. High blood pressure (hypertension) is a long-term (chronic) condition in which blood pressure is elevated. A blood pressure reading is recorded as two numbers, such as 120 over 80 (or 120/80). The first, higher number is called the systolic pressure. It is a measure of the pressure in your arteries as the heart beats. The second, lower number is called the diastolic pressure. It is a measure of the pressure in your arteries as the heart relaxes between beats.  Keeping your blood pressure in a normal range is important to your overall health and prevention of health problems, such as heart disease and stroke. When your blood pressure is uncontrolled, your heart has to work harder than normal. High blood pressure is a very common condition in adults because blood pressure tends to rise with age. Men and women are equally likely to have hypertension but at different times in life. Before age 75, men are more likely to have hypertension. After 42 years of age, women are more likely to have it. Hypertension is especially common in African Americans. This condition often has no signs or symptoms. The cause of the condition is usually not known. Your caregiver can help you come up with a plan to keep your blood pressure in a normal, healthy range. BLOOD PRESSURE STAGES Blood pressure is classified into four stages: normal, prehypertension, stage 1, and stage 2. Your blood pressure reading will be used to determine what type of treatment, if any, is necessary.  Appropriate treatment options are tied to these four stages:  Normal  Systolic pressure (mm Hg): below 120.  Diastolic pressure (mm Hg): below 80. Prehypertension  Systolic pressure (mm Hg): 120 to 139.  Diastolic pressure (mm Hg): 80 to 89. Stage1  Systolic pressure (mm Hg): 140 to 159.  Diastolic pressure (mm Hg): 90 to 99. Stage2  Systolic pressure (mm Hg): 160 or above.  Diastolic pressure (mm Hg): 100 or above. RISKS RELATED TO HIGH BLOOD PRESSURE Managing your blood pressure is an important responsibility. Uncontrolled high blood pressure can lead to:  A heart attack.  A stroke.  A weakened blood vessel (aneurysm).  Heart failure.  Kidney damage.  Eye damage.  Metabolic syndrome.  Memory and concentration problems. HOW TO MANAGE YOUR BLOOD PRESSURE Blood pressure can be managed effectively with lifestyle changes and medicines (if needed). Your caregiver will help you come up with a plan to bring your blood pressure within a normal range. Your plan should include the following: Education  Read all information provided by your caregivers about how to control blood pressure.  Educate yourself on the latest guidelines and treatment recommendations. New research is always being done to further define the risks and treatments for high blood pressure. Lifestylechanges  Control your weight.  Avoid smoking.  Stay physically active.  Reduce the amount of salt in your diet.  Reduce stress.  Control any chronic conditions, such as high cholesterol or diabetes.  Reduce your alcohol intake. Medicines  Several medicines (antihypertensive medicines) are available, if needed, to bring blood pressure within a normal range.  Communication  Review all the medicines you take with your caregiver because there may be side effects or interactions.  Talk with your caregiver about your diet, exercise habits, and other lifestyle factors that may be contributing to  high blood pressure.  See your caregiver regularly. Your caregiver can help you create and adjust your plan for managing high blood pressure. RECOMMENDATIONS FOR TREATMENT AND FOLLOW-UP  The following recommendations are based on current guidelines for managing high blood pressure in nonpregnant adults. Use these recommendations to identify the proper follow-up period or treatment option based on your blood pressure reading. You can discuss these options with your caregiver.  Systolic pressure of 299 to 371 or diastolic pressure of 80 to 89: Follow up with your caregiver as directed.  Systolic pressure of 696 to 789 or diastolic pressure of 90 to 100: Follow up with your caregiver within 2 months.  Systolic pressure above 381 or diastolic pressure above 017: Follow up with your caregiver within 1 month.  Systolic pressure above 510 or diastolic pressure above 258: Consider antihypertensive therapy; follow up with your caregiver within 1 week.  Systolic pressure above 527 or diastolic pressure above 782: Begin antihypertensive therapy; follow up with your caregiver within 1 week.   This information is not intended to replace advice given to you by your health care provider. Make sure you discuss any questions you have with your health care provider.   Document Released: 11/14/2011 Document Reviewed: 11/14/2011 Elsevier Interactive Patient Education 2016 [place] An incision is when a surgeon cuts into your body. After surgery, the incision needs to be cared for properly to prevent infection.  HOW TO CARE FOR YOUR INCISION  Take medicines only as directed by your health care provider.  There are many different ways to close and cover an incision, including stitches, skin glue, and adhesive strips. Follow your health care provider's instructions on:  Incision care.  Bandage (dressing) changes and removal.  Incision closure removal.  Do not take baths, swim, or use  a hot tub until your health care provider approves. You may shower as directed by your health care provider.  Resume your normal diet and activities as directed.  Use anti-itch medicine (such as an antihistamine) as directed by your health care provider. The incision may itch while it is healing. Do not pick or scratch at the incision.  Drink enough fluid to keep your urine clear or pale yellow. SEEK MEDICAL CARE IF:   You have drainage, redness, swelling, or pain at your incision site.  You have muscle aches, chills, or a general ill feeling.  You notice a bad smell coming from the incision or dressing.  Your incision edges separate after the sutures, staples, or skin adhesive strips have been removed.  You have persistent nausea or vomiting.  You have a fever.  You are dizzy. SEEK IMMEDIATE MEDICAL CARE IF:   You have a rash.  You faint.  You have difficulty breathing. MAKE SURE YOU:   Understand these instructions.  Will watch your condition.  Will get help right away if you are not doing well or get worse.   This information is not intended to replace advice given to you by your health care provider. Make sure you discuss any questions you have with your health care provider.   Document Released: 09/08/2004 Document Revised: 03/12/2014 Document Reviewed: 04/15/2013 Elsevier Interactive Patient Education [employer].

## 2015-04-18 NOTE — ED Notes (Addendum)
Pt reports R hand suture check.  States the sutures aren't ready to be taken out.  It appears that the wound is not approximated.  No drainage noted at this time.  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  Pt's BP is elevated. Pt has hx of HTN and has not taken his meds for over year.

## 2016-02-05 ENCOUNTER — Emergency Department (HOSPITAL_COMMUNITY)
Admission: EM | Admit: 2016-02-05 | Discharge: 2016-02-05 | Disposition: A | Discharge status: Home / Self Care | Payer: Managed Care, Other (non HMO) | Attending: Emergency Medicine | Admitting: Emergency Medicine

## 2016-02-05 ENCOUNTER — Encounter (HOSPITAL_COMMUNITY): Payer: Self-pay | Admitting: Emergency Medicine

## 2016-02-05 DIAGNOSIS — I10 Essential (primary) hypertension: Secondary | ICD-10-CM | POA: Diagnosis not present

## 2016-02-05 DIAGNOSIS — Z5321 Procedure and treatment not carried out due to patient leaving prior to being seen by health care provider: Secondary | ICD-10-CM | POA: Diagnosis not present

## 2016-02-05 DIAGNOSIS — F10239 Alcohol dependence with withdrawal, unspecified: Secondary | ICD-10-CM | POA: Diagnosis present

## 2016-02-05 DIAGNOSIS — F1910 Other psychoactive substance abuse, uncomplicated: Secondary | ICD-10-CM

## 2016-02-05 DIAGNOSIS — F1423 Cocaine dependence with withdrawal: Secondary | ICD-10-CM | POA: Insufficient documentation

## 2016-02-05 DIAGNOSIS — F17210 Nicotine dependence, cigarettes, uncomplicated: Secondary | ICD-10-CM | POA: Insufficient documentation

## 2016-02-05 NOTE — ED Triage Notes (Signed)
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  Last use of both was last night.

## 2016-02-05 NOTE — ED Notes (Signed)
Pt not in stretcher and phone charger is gone. Assumed pt left prior to receiving discharge papers. Pt had been talking about leaving as soon as possible while on phone.

## 2016-02-05 NOTE — ED Notes (Signed)
Pt has been speaking on his phone looking for a ride home ever since [employer] left. Would not get off phone to speak with PA. Only got off briefly afterwards to ask for water.

## 2016-02-05 NOTE — ED Provider Notes (Signed)
Patient is 40 yo M brought in by [facility] for alcohol and cocaine use last night. I went to evaluate patient, but he was talking loudly on his phone, asking someone to come pick him up. He refused to get off his phone, and after several minutes, I left to evaluate another patient. When I returned, patient was not in stretcher. Assumed patient eloped prior to evaluation.   [place], PA 02/05/16 [place], PA 02/05/16 1235    Charlesetta Shanks, MD 02/06/16 [phone]

## 2016-02-05 NOTE — ED Notes (Signed)
Pt talking loudly on his cell phone, refused to hang up when PA came to evaluate him.

## 2016-02-05 NOTE — ED Notes (Signed)
Patient denies use of ETOH and cocaine everyday, state it's just occasional.

## 2016-04-23 ENCOUNTER — Ambulatory Visit (INDEPENDENT_AMBULATORY_CARE_PROVIDER_SITE_OTHER): Payer: 59 | Admitting: Physician Assistant

## 2016-04-23 VITALS — BP 114/76 | HR 74 | Temp 98.8°F | Resp 16 | Ht 67.0 in | Wt 209.0 lb

## 2016-04-23 DIAGNOSIS — L723 Sebaceous cyst: Secondary | ICD-10-CM | POA: Diagnosis not present

## 2016-04-23 DIAGNOSIS — L089 Local infection of the skin and subcutaneous tissue, unspecified: Secondary | ICD-10-CM | POA: Diagnosis not present

## 2016-04-23 MED ORDER — CEPHALEXIN 500 MG PO CAPS: 500.0000 mg | ORAL_CAPSULE | Freq: Two times a day (BID) | ORAL | 0 refills | Status: DC

## 2016-04-23 MED ORDER — CEPHALEXIN 500 MG PO CAPS: 500.0000 mg | ORAL_CAPSULE | Freq: Two times a day (BID) | ORAL | 0 refills | Status: AC

## 2016-04-23 NOTE — Patient Instructions (Addendum)
Return in 2 days for recheck of your wound.   Please take antibiotic as discussed. Please change the top dressing daily.    Incision and Drainage, Care After Refer to this sheet in the next few weeks. These instructions provide you with information about caring for yourself after your procedure. Your health care provider may also give you more specific instructions. Your treatment has been planned according to current medical practices, but problems sometimes occur. Call your health care provider if you have any problems or questions after your procedure. What can I expect after the procedure? After the procedure, it is common to have:  Pain or discomfort around your incision site.  Drainage from your incision. Follow these instructions at home:  Take over-the-counter and prescription medicines only as told by your health care provider.  If you were prescribed an antibiotic medicine, take it as told by your health care provider.Do not stop taking the antibiotic even if you start to feel better.  Followinstructions from your health care provider about:  How to take care of your incision.  When and how you should change your packing and bandage (dressing). Wash your hands with soap and water before you change your dressing. If soap and water are not available, use hand sanitizer.  When you should remove your dressing.  Do not take baths, swim, or use a hot tub until your health care provider approves.  Keep all follow-up visits as told by your health care provider. This is important.  Check your incision area every day for signs of infection. Check for:  More redness, swelling, or pain.  More fluid or blood.  Warmth.  Pus or a bad smell. Contact a health care provider if:  Your cyst or abscess returns.  You have a fever.  You have more redness, swelling, or pain around your incision.  You have more fluid or blood coming from your incision.  Your incision feels warm to  the touch.  You have pus or a bad smell coming from your incision. Get help right away if:  You have severe pain or bleeding.  You cannot eat or drink without vomiting.  You have decreased urine output.  You become short of breath.  You have chest pain.  You cough up blood.  The area where the incision and drainage occurred becomes numb or it tingles. This information is not intended to replace advice given to you by your health care provider. Make sure you discuss any questions you have with your health care provider. Document Released: 05/14/2011 Document Revised: 07/22/2015 Document Reviewed: 12/10/2014 Elsevier Interactive Patient Education  2017 [employer].     IF you received an x-ray today, you will receive an invoice from [facility]. Please contact [facility] at [phone] with questions or concerns regarding your invoice.   IF you received labwork today, you will receive an invoice from [facility]. Please contact [facility] at [phone] with questions or concerns regarding your invoice.   Our billing staff will not be able to assist you with questions regarding bills from these companies.  You will be contacted with the lab results as soon as they are available. The fastest way to get your results is to activate your My Chart account. Instructions are located on the last page of this paperwork. If you have not heard from [place] regarding the results in 2 weeks, please contact this office.

## 2016-04-26 ENCOUNTER — Ambulatory Visit (INDEPENDENT_AMBULATORY_CARE_PROVIDER_SITE_OTHER): Payer: 59 | Admitting: Physician Assistant

## 2016-04-26 VITALS — BP 106/68 | HR 65 | Temp 98.4°F | Resp 16 | Ht 69.75 in | Wt 207.4 lb

## 2016-04-26 DIAGNOSIS — L723 Sebaceous cyst: Secondary | ICD-10-CM

## 2016-04-26 DIAGNOSIS — L089 Local infection of the skin and subcutaneous tissue, unspecified: Secondary | ICD-10-CM

## 2016-04-26 LAB — WOUND CULTURE

## 2016-04-26 NOTE — Progress Notes (Signed)
   04/26/2016 9:24 AM   DOB: 1975/03/27 / MRN: [id number]  SUBJECTIVE:  Aaron Mccoy is a well appearing 41 y.o. here today for wound care. He denies exquisite tenderness at the site of the wound, nausea, emesis, fever and chills.  He has been compliant with medical therapy and recommendations thus far.   He has No Known Allergies.   He  has a past medical history of A-fib ([place]); Hypertension; and Irregular heartbeat.    He  reports that he has been smoking Cigarettes.  He has been smoking about 1.00 pack per day. He has never used smokeless tobacco. He reports that he drinks alcohol. He reports that he uses drugs, including Cocaine. He  has no sexual activity history on file. The patient  has no past surgical history on file.  His family history is not on file.  ROS  Per HPI  Problem list and medications reviewed and updated by myself where necessary, and exist elsewhere in the encounter.   OBJECTIVE:  BP 106/68 (BP Location: Right Arm, Patient Position: Sitting, Cuff Size: Large)   Pulse 65   Temp 98.4 F (36.9 C) (Oral)   Resp 16   Ht 5' 9.75" (1.772 m)   Wt 207 lb 6.4 oz (94.1 kg)   BMI 29.97 kg/m  CrCl cannot be calculated (Patient's most recent lab result is older than the maximum 21 days allowed.).  Physical Exam  Constitutional: Vital signs are normal. He appears well-developed.  Skin: He is not diaphoretic.       No results found for this or any previous visit (from the past 48 hour(s)).  ASSESSMENT AND PLAN  Aaron Mccoy was seen today for follow-up.  Diagnoses and all orders for this visit:  Infected sebaceous cyst Comments: Resolving.  Repacked lightly and dressed.  Advised he pull the packing in three days.  RTC if any problems.    The patient was advised to call or return to clinic if he does not see an improvement in symptoms or to seek the care of the closest emergency department if he worsens with the above plan.   Philis Fendt, MHS, PA-C [facility] 04/26/2016 9:24 AM

## 2016-04-26 NOTE — Patient Instructions (Signed)
     IF you received an x-ray today, you will receive an invoice from [facility]. Please contact [facility] at [phone] with questions or concerns regarding your invoice.   IF you received labwork today, you will receive an invoice from [facility]. Please contact [facility] at [phone] with questions or concerns regarding your invoice.   Our billing staff will not be able to assist you with questions regarding bills from these companies.  You will be contacted with the lab results as soon as they are available. The fastest way to get your results is to activate your My Chart account. Instructions are located on the last page of this paperwork. If you have not heard from us regarding the results in 2 weeks, please contact this office.     

## 2016-05-02 NOTE — Progress Notes (Signed)
[facility] [address], [place] [address] [phone]  Date:  04/23/2016   Name:  Aaron Mccoy   DOB:  08/21/1975   MRN:  [id number]  PCP:  No PCP Per Patient    History of Present Illness:  Aaron Mccoy is a 41 y.o. male patient who presents to [facility] for cc of bump.  This has been present for more than a year at the upper back, but has not been symptomatic.  Within the last week, he has had pain in this area.  Hurts to touch.  No drainage.  No fever.       There are no active problems to display for this patient.   Past Medical History:  Diagnosis Date  . A-fib ([place])   . Hypertension   . Irregular heartbeat     History reviewed. No pertinent surgical history.  Social History  Substance Use Topics  . Smoking status: Current Every Day Smoker    Packs/day: 1.00    Types: Cigarettes  . Smokeless tobacco: Never Used  . Alcohol use Yes    History reviewed. No pertinent family history.  No Known Allergies  Medication list has been reviewed and updated.  Current Outpatient Prescriptions on File Prior to Visit  Medication Sig Dispense Refill  . hydrochlorothiazide (HYDRODIURIL) 25 MG tablet Take 1 tablet (25 mg total) by mouth daily. (Patient not taking: Reported on 04/23/2016) 30 tablet 1   No current facility-administered medications on file prior to visit.     ROS ROS otherwise unremarkable unless listed above.   Physical Examination: BP 114/76 (BP Location: Right Arm, Patient Position: Sitting, Cuff Size: Small)   Pulse 74   Temp 98.8 F (37.1 C) (Oral)   Resp 16   Ht 5' 7"  (1.702 m)   Wt 209 lb (94.8 kg)   SpO2 100%   BMI 32.73 kg/m  Ideal Body Weight: Weight in (lb) to have BMI = 25: 159.3  Physical Exam  Constitutional: He is oriented to person, place, and time. He appears well-developed and well-nourished. No distress.  HENT:  Head: Normocephalic and atraumatic.  Eyes: Conjunctivae and EOM are normal. Pupils are  equal, round, and reactive to light.  Cardiovascular: Normal rate.   Pulmonary/Chest: Effort normal. No respiratory distress.  Neurological: He is alert and oriented to person, place, and time.  Skin: Skin is warm and dry. He is not diaphoretic.     Psychiatric: He has a normal mood and affect. His behavior is normal.   1cm fluctuant nodule with punctate at center.  Very mild erythema.  No drainage.  Normal cervical rom.    Procedure: verbal consent obtained.  1% lidocaine placed after alcohol swabbing.  Anesthesia obtained.  povidine swabbed for cleansing.  11 blade utilized to place a 1cm incision.  Purulent drainage expressed.  Sebaceous material extracted with searching for loculations.  Irrigated with normal saline.  Quarter packing placed.  Dressings applied.  Assessment and Plan: Aaron Mccoy is a 42 y.o. male who is here today for cc of  Bump on back of neck. Wound care discussed rtc in 2 days for follow up and repacking. Infected sebaceous cyst - Plan: WOUND CULTURE  Ivar Drape, PA-C [facility] 2/28/20187:54 PM

## 2016-10-09 ENCOUNTER — Encounter (HOSPITAL_COMMUNITY): Payer: Self-pay | Admitting: Emergency Medicine

## 2016-10-09 ENCOUNTER — Emergency Department (HOSPITAL_COMMUNITY)
Admission: EM | Admit: 2016-10-09 | Discharge: 2016-10-09 | Disposition: A | Discharge status: Home / Self Care | Payer: Managed Care, Other (non HMO) | Attending: Emergency Medicine | Admitting: Emergency Medicine

## 2016-10-09 DIAGNOSIS — G44209 Tension-type headache, unspecified, not intractable: Secondary | ICD-10-CM | POA: Insufficient documentation

## 2016-10-09 DIAGNOSIS — Z79899 Other long term (current) drug therapy: Secondary | ICD-10-CM | POA: Diagnosis not present

## 2016-10-09 DIAGNOSIS — R51 Headache: Secondary | ICD-10-CM | POA: Diagnosis present

## 2016-10-09 DIAGNOSIS — F17210 Nicotine dependence, cigarettes, uncomplicated: Secondary | ICD-10-CM | POA: Insufficient documentation

## 2016-10-09 DIAGNOSIS — I10 Essential (primary) hypertension: Secondary | ICD-10-CM | POA: Diagnosis not present

## 2016-10-09 MED ORDER — METHOCARBAMOL 500 MG PO TABS: 500.0000 mg | ORAL_TABLET | Freq: Three times a day (TID) | ORAL | 0 refills | Status: DC | PRN

## 2016-10-09 MED ORDER — NAPROXEN 500 MG PO TABS: 500.0000 mg | ORAL_TABLET | Freq: Two times a day (BID) | ORAL | 0 refills | Status: DC

## 2016-10-09 MED ORDER — METHOCARBAMOL 500 MG PO TABS
1000.0000 mg | ORAL_TABLET | Freq: Once | ORAL | Status: AC
  Administered 2016-10-09: 1000 mg via ORAL
  Filled 2016-10-09: qty 2

## 2016-10-09 MED ORDER — IBUPROFEN 800 MG PO TABS
800.0000 mg | ORAL_TABLET | Freq: Once | ORAL | Status: AC
  Administered 2016-10-09: 800 mg via ORAL
  Filled 2016-10-09: qty 1

## 2016-10-09 NOTE — ED Triage Notes (Signed)
C/o pain to left lateral posterior thorax worse with movement and palpation x several months, left leg pain and tingling onset yesterday; general malaise; aching tight headache worse with movement of facial muscles, photophobia x 1 week. No phonophobia. Stopped taking prescribed metoprolol 2 months ago.

## 2016-10-09 NOTE — ED Provider Notes (Signed)
[facility] DEPT Provider Note   CSN: [id number] Arrival date & time: 10/09/16  0814     History   Chief Complaint Chief Complaint  Patient presents with  . Headache    HPI Aaron Mccoy is a 41 y.o. male. Chief complaint is headache, neck pain, and leg pain  HPI 41 year old male. States she's had a headache for the last 2 days. Describes it as "like a circle" around my head". Is tender to touch on. States when he squints that it becomes more painful. He literally states it is "like wearing a tight hat". No associated symptoms of vision changes, or nausea. Has some pain left upper back near her shoulder blade. He states that this hurts when he works. He works doing Hydrologist. States he was mowing the yard yesterday and his left leg became sore and painful. Primarily in the back "by my hamstring".  Patient does bring in prescriptions for Xarelto and metoprolol. Initially states he does not know why he is taking them. After some additional questioning he states he does have high blood pressure" I'm in A. fib patient". He does not recall his cardiologist. He states his primary care has prescribed him Xarelto in the past. He states "everybody know tells me it's too strong so I don't take it.  Past Medical History:  Diagnosis Date  . A-fib ([place])   . Hypertension   . Irregular heartbeat     There are no active problems to display for this patient.   History reviewed. No pertinent surgical history.     Home Medications    Prior to Admission medications   Medication Sig Start Date End Date Taking? Authorizing Provider  Aspirin-Salicylamide-Caffeine (BC HEADACHE POWDER PO) Take 1-2 packets by mouth every 6 (six) hours as needed (for pain).   Yes [provider]  metoprolol succinate (TOPROL-XL) 25 MG 24 hr tablet Take 25 mg by mouth daily.   Yes [provider]  rivaroxaban (XARELTO) 20 MG TABS tablet Take 20 mg by mouth daily with supper.   Yes [provider]  hydrochlorothiazide (HYDRODIURIL) 25 MG tablet Take 1 tablet (25 mg total) by mouth daily. Patient not taking: Reported on 04/23/2016 04/18/15   Margarita Mail, PA-C  methocarbamol (ROBAXIN) 500 MG tablet Take 1 tablet (500 mg total) by mouth 3 (three) times daily between meals as needed. 10/09/16   Tanna Furry, MD  naproxen (NAPROSYN) 500 MG tablet Take 1 tablet (500 mg total) by mouth 2 (two) times daily. 10/09/16   Tanna Furry, MD    Family History History reviewed. No pertinent family history.  Social History Social History  Substance Use Topics  . Smoking status: Current Every Day Smoker    Packs/day: 1.00    Types: Cigarettes  . Smokeless tobacco: Never Used  . Alcohol use Yes     Allergies   Patient has no known allergies.   Review of Systems Review of Systems  Constitutional: Negative for appetite change, chills, diaphoresis, fatigue and fever.  HENT: Negative for mouth sores, sore throat and trouble swallowing.   Eyes: Negative for visual disturbance.  Respiratory: Negative for cough, chest tightness, shortness of breath and wheezing.   Cardiovascular: Negative for chest pain.  Gastrointestinal: Negative for abdominal distention, abdominal pain, diarrhea, nausea and vomiting.  Endocrine: Negative for polydipsia, polyphagia and polyuria.  Genitourinary: Negative for dysuria, frequency and hematuria.  Musculoskeletal: Positive for back pain. Negative for gait problem.       Left leg  pain  Skin: Negative for color change, pallor and rash.  Neurological: Positive for headaches. Negative for dizziness, syncope and light-headedness.  Hematological: Does not bruise/bleed easily.  Psychiatric/Behavioral: Negative for behavioral problems and confusion.     Physical Exam Updated Vital Signs BP (!) 141/97 (BP Location: Left Arm)   Pulse 73   Temp 98.3 F (36.8 C) (Oral)   Resp 16   SpO2 100%   Physical Exam  Constitutional: He is oriented to person,  place, and time. He appears well-developed and well-nourished. No distress.  HENT:  Head: Normocephalic.  Tender to palpate along the temples and anterior for head. His area of description of pain is consistent with muscle tension headache. Otherwise normal HEENT exam.  Eyes: Pupils are equal, round, and reactive to light. Conjunctivae are normal. No scleral icterus.  Neck: Normal range of motion. Neck supple. No thyromegaly present.  Cardiovascular: Normal rate and regular rhythm.  Exam reveals no gallop and no friction rub.   No murmur heard. Pulmonary/Chest: Effort normal and breath sounds normal. No respiratory distress. He has no wheezes. He has no rales.  Abdominal: Soft. Bowel sounds are normal. He exhibits no distension. There is no tenderness. There is no rebound.  Musculoskeletal: Normal range of motion.  Tenderness palpate an area of the rhomboids and left upper back. Normal range of motion of the shoulder and neck.  Points to his left posterior leg as area of discomfort to the leg. Symptoms consistent with sciatica.  Neurological: He is alert and oriented to person, place, and time.  Normal symmetric Strength to shoulder shrug, triceps, biceps, grip,wrist flex/extend,and intrinsics  Norma lsymmetric sensation above and below clavicles, and to all distributions to UEs. Norma symmetric strength to flex/.extend hip and knees, dorsi/plantar flex ankles. Normal symmetric sensation to all distributions to LEs Patellar and achilles reflexes 1-2+. Downgoing Babinski   Skin: Skin is warm and dry. No rash noted.  Psychiatric: He has a normal mood and affect. His behavior is normal.     ED Treatments / Results  Labs (all labs ordered are listed, but only abnormal results are displayed) Labs Reviewed - No data to display  EKG  EKG Interpretation None       Radiology No results found.  Procedures Procedures (including critical care time)  Medications Ordered in  ED Medications  methocarbamol (ROBAXIN) tablet 1,000 mg (not administered)  ibuprofen (ADVIL,MOTRIN) tablet 800 mg (not administered)     Initial Impression / Assessment and Plan / ED Course  I have reviewed the triage vital signs and the nursing notes.  Pertinent labs & imaging results that were available during my care of the patient were reviewed by me and considered in my medical decision making (see chart for details).   symptoms and findings and history consistent with muscle tension headache, muscle skeletal shoulder pain, and sciatica. EKG shows sinus rhythm. He has a chads Vascor of 1 based on a history of hypertension. I did discuss with him that if he was hesitant to take a Xarelto that he should take a daily baby aspirin unless otherwise instructed by his cardiology is. He does not recall his cardiologist or the last time he has been seen. Encouraged to take his metoprolol, and daily aspirin. Prescription for naproxen and Robaxin for his muscle tension headache.  Final Clinical Impressions(s) / ED Diagnoses   Final diagnoses:  Tension headache    New Prescriptions New Prescriptions   METHOCARBAMOL (ROBAXIN) 500 MG TABLET    Take  1 tablet (500 mg total) by mouth 3 (three) times daily between meals as needed.   NAPROXEN (NAPROSYN) 500 MG TABLET    Take 1 tablet (500 mg total) by mouth 2 (two) times daily.     Tanna Furry, MD 10/09/16 [phone]

## 2016-10-09 NOTE — Discharge Instructions (Signed)
Muscle relaxant, and anti-inflammatory prescriptions for your tension headache.  Take daily 81 mg baby aspirin to prevent complications from your intermittent atrial fibrillation.  Restart, and continue your metoprolol daily for your blood pressure, and history of intermittent A. fib.

## 2017-04-24 DIAGNOSIS — Z8679 Personal history of other diseases of the circulatory system: Secondary | ICD-10-CM | POA: Insufficient documentation

## 2017-04-24 DIAGNOSIS — I10 Essential (primary) hypertension: Secondary | ICD-10-CM | POA: Insufficient documentation

## 2017-07-14 ENCOUNTER — Emergency Department (HOSPITAL_COMMUNITY): Payer: 59

## 2017-07-14 ENCOUNTER — Encounter (HOSPITAL_COMMUNITY): Payer: Self-pay | Admitting: Emergency Medicine

## 2017-07-14 ENCOUNTER — Emergency Department (HOSPITAL_COMMUNITY)
Admission: EM | Admit: 2017-07-14 | Discharge: 2017-07-14 | Disposition: A | Discharge status: Home / Self Care | Payer: 59 | Attending: Physician Assistant | Admitting: Physician Assistant

## 2017-07-14 DIAGNOSIS — F17210 Nicotine dependence, cigarettes, uncomplicated: Secondary | ICD-10-CM | POA: Diagnosis not present

## 2017-07-14 DIAGNOSIS — I4891 Unspecified atrial fibrillation: Secondary | ICD-10-CM | POA: Insufficient documentation

## 2017-07-14 DIAGNOSIS — Z79899 Other long term (current) drug therapy: Secondary | ICD-10-CM | POA: Diagnosis not present

## 2017-07-14 DIAGNOSIS — Y998 Other external cause status: Secondary | ICD-10-CM | POA: Diagnosis not present

## 2017-07-14 DIAGNOSIS — Y929 Unspecified place or not applicable: Secondary | ICD-10-CM | POA: Diagnosis not present

## 2017-07-14 DIAGNOSIS — S3992XA Unspecified injury of lower back, initial encounter: Secondary | ICD-10-CM | POA: Diagnosis present

## 2017-07-14 DIAGNOSIS — S20222A Contusion of left back wall of thorax, initial encounter: Secondary | ICD-10-CM | POA: Insufficient documentation

## 2017-07-14 DIAGNOSIS — S29019A Strain of muscle and tendon of unspecified wall of thorax, initial encounter: Secondary | ICD-10-CM | POA: Diagnosis not present

## 2017-07-14 DIAGNOSIS — T148XXA Other injury of unspecified body region, initial encounter: Secondary | ICD-10-CM

## 2017-07-14 DIAGNOSIS — Y939 Activity, unspecified: Secondary | ICD-10-CM | POA: Insufficient documentation

## 2017-07-14 DIAGNOSIS — I10 Essential (primary) hypertension: Secondary | ICD-10-CM | POA: Insufficient documentation

## 2017-07-14 DIAGNOSIS — W109XXA Fall (on) (from) unspecified stairs and steps, initial encounter: Secondary | ICD-10-CM | POA: Diagnosis not present

## 2017-07-14 DIAGNOSIS — S20212A Contusion of left front wall of thorax, initial encounter: Secondary | ICD-10-CM

## 2017-07-14 MED ORDER — HYDROCODONE-ACETAMINOPHEN 5-325 MG PO TABS
1.0000 | ORAL_TABLET | Freq: Once | ORAL | Status: AC
  Administered 2017-07-14: 1 via ORAL
  Filled 2017-07-14: qty 1

## 2017-07-14 MED ORDER — BACITRACIN ZINC 500 UNIT/GM EX OINT
TOPICAL_OINTMENT | Freq: Two times a day (BID) | CUTANEOUS | Status: DC
  Administered 2017-07-14: 1 via TOPICAL
  Filled 2017-07-14: qty 0.9

## 2017-07-14 MED ORDER — CYCLOBENZAPRINE HCL 10 MG PO TABS: 10.0000 mg | ORAL_TABLET | Freq: Two times a day (BID) | ORAL | 0 refills | Status: DC | PRN

## 2017-07-14 MED ORDER — DICLOFENAC SODIUM 50 MG PO TBEC: 50.0000 mg | DELAYED_RELEASE_TABLET | Freq: Two times a day (BID) | ORAL | 0 refills | Status: DC

## 2017-07-14 MED ORDER — CYCLOBENZAPRINE HCL 10 MG PO TABS
10.0000 mg | ORAL_TABLET | Freq: Once | ORAL | Status: AC
  Administered 2017-07-14: 10 mg via ORAL
  Filled 2017-07-14: qty 1

## 2017-07-14 NOTE — ED Provider Notes (Signed)
[facility] DEPT Provider Note   CSN: [id number] Arrival date & time: 07/14/17  1015     History   Chief Complaint Chief Complaint  Patient presents with  . Back Pain    HPI Aaron Mccoy is a 42 y.o. male who presents to the ED with low back pain after falling down 4 wooden steps last night. Patient reports that he missed a step and landed on his left side and lower back. Patient reports taking Goody powder last night that did help some. Patient denies LOC or head injury.  HPI  Past Medical History:  Diagnosis Date  . A-fib ([place])   . Hypertension   . Irregular heartbeat     There are no active problems to display for this patient.   History reviewed. No pertinent surgical history.      Home Medications    Prior to Admission medications   Medication Sig Start Date End Date Taking? Authorizing Provider  Aspirin-Salicylamide-Caffeine (BC HEADACHE POWDER PO) Take 1-2 packets by mouth every 6 (six) hours as needed (for pain).    [provider]  cyclobenzaprine (FLEXERIL) 10 MG tablet Take 1 tablet (10 mg total) by mouth 2 (two) times daily as needed for muscle spasms. 07/14/17   Ashley Murrain, NP  diclofenac (VOLTAREN) 50 MG EC tablet Take 1 tablet (50 mg total) by mouth 2 (two) times daily. 07/14/17   Ashley Murrain, NP  hydrochlorothiazide (HYDRODIURIL) 25 MG tablet Take 1 tablet (25 mg total) by mouth daily. Patient not taking: Reported on 04/23/2016 04/18/15   Margarita Mail, PA-C  metoprolol succinate (TOPROL-XL) 25 MG 24 hr tablet Take 25 mg by mouth daily.    [provider]  rivaroxaban (XARELTO) 20 MG TABS tablet Take 20 mg by mouth daily with supper.    [provider]   **Patient reports he is no longer taking blood thinners. **  Family History History reviewed. No pertinent family history.  Social History Social History   Tobacco Use  . Smoking status: Current Every Day Smoker    Packs/day: 1.00     Types: Cigarettes  . Smokeless tobacco: Never Used  Substance Use Topics  . Alcohol use: Yes  . Drug use: Yes    Types: Cocaine     Allergies   Patient has no known allergies.   Review of Systems Review of Systems  Cardiovascular:       Left rib pain  Musculoskeletal: Positive for arthralgias.  Skin: Wound: abrasion to left side of back.  All other systems reviewed and are negative.    Physical Exam Updated Vital Signs BP (!) 144/97 (BP Location: Right Arm)   Pulse 94   Temp 98.4 F (36.9 C) (Oral)   Resp 18   SpO2 100%   Physical Exam  Constitutional: He appears well-developed and well-nourished. No distress.  HENT:  Head: Normocephalic and atraumatic.  Eyes: EOM are normal.  Neck: Normal range of motion. Neck supple.  Cardiovascular: Normal rate.  Pulmonary/Chest: Effort normal.  Tender with palpation to the left posterior ribs.  Abdominal: Soft. There is no tenderness.  Musculoskeletal:       Lumbar back: He exhibits tenderness and spasm. He exhibits normal pulse. Decreased range of motion: due to pain.  Small abrasion noted to the left lumbar area. Grips are equal.   Neurological: He is alert. He has normal strength.  Reflex Scores:      Bicep reflexes are 2+ on the right  side.      Brachioradialis reflexes are 2+ on the right side and 2+ on the left side.      Patellar reflexes are 2+ on the right side and 2+ on the left side. Skin: Skin is warm and dry.  Psychiatric: He has a normal mood and affect. His behavior is normal.  Nursing note and vitals reviewed.    ED Treatments / Results  Labs (all labs ordered are listed, but only abnormal results are displayed) Labs Reviewed - No data to display  Radiology Dg Ribs Unilateral W/chest Left  Result Date: 07/14/2017 CLINICAL DATA:  42 year old male with a history of fall EXAM: LEFT RIBS AND CHEST - 3+ VIEW COMPARISON:  04/02/2013 FINDINGS: Cardiomediastinal silhouette within normal limits. No  evidence of central vascular congestion. No pneumothorax or pleural effusion. No confluent airspace disease. No acute displaced rib fracture. IMPRESSION: No radiographic evidence of acute cardiopulmonary disease. No acute displaced fracture Electronically Signed   By: Corrie Mckusick D.O.   On: 07/14/2017 12:45   Dg Lumbar Spine Complete  Result Date: 07/14/2017 CLINICAL DATA:  42 year old male with a history of fall and lower back pain EXAM: LUMBAR SPINE - COMPLETE 4+ VIEW COMPARISON:  None. FINDINGS: Lumbar Spine: Lumbar vertebral elements maintain normal alignment without evidence of anterolisthesis, retrolisthesis, subluxation. No fracture line identified. Vertebral body heights maintained as well as disc space heights. No significant degenerative disc disease or endplate changes. No significant facet changes. Unremarkable appearance of the visualized abdomen. Oblique images demonstrate no displaced pars defect. IMPRESSION: Negative for acute fracture malalignment of the lumbar spine. Electronically Signed   By: Corrie Mckusick D.O.   On: 07/14/2017 12:44    Procedures Procedures (including critical care time)  Medications Ordered in ED Medications  cyclobenzaprine (FLEXERIL) tablet 10 mg (10 mg Oral Given 07/14/17 1214)  HYDROcodone-acetaminophen (NORCO/VICODIN) 5-325 MG per tablet 1 tablet (1 tablet Oral Given 07/14/17 1214)     Initial Impression / Assessment and Plan / ED Course  I have reviewed the triage vital signs and the nursing notes. 42 y.o. male here with pain to the lower back and left rib area s/p fall on wooden steps last night. Stable for d/c without acute findings on x-ray. Pain improved with treatment in the ED. No neuro deficits. Will treat with NSAIDS and muscle relaxer and patient will f/u with PCP or return here for worsening symptoms.   Final Clinical Impressions(s) / ED Diagnoses   Final diagnoses:  Contusion of left side of back, initial encounter  Rib contusion, left,  initial encounter  Muscle strain    ED Discharge Orders        Ordered    diclofenac (VOLTAREN) 50 MG EC tablet  2 times daily     07/14/17 1303    cyclobenzaprine (FLEXERIL) 10 MG tablet  2 times daily PRN     07/14/17 1303       Debroah Baller [place], NP 07/14/17 1308    Macarthur Critchley, MD 07/17/17 1516

## 2017-07-14 NOTE — ED Triage Notes (Signed)
Pt fell down 4 stairs last night and injured back, c/o L lower back pain. Pt took Goody powder last night with some relief.

## 2017-07-14 NOTE — ED Notes (Signed)
Bed: WTR8 Expected date:  Expected time:  Means of arrival:  Comments:

## 2017-07-14 NOTE — Discharge Instructions (Addendum)
Follow up with your primary care doctor or return here for worsening symptoms.  Do not drive while taking the muscle relaxer as it will make you sleepy.

## 2017-08-23 DIAGNOSIS — K219 Gastro-esophageal reflux disease without esophagitis: Secondary | ICD-10-CM | POA: Insufficient documentation

## 2017-11-21 ENCOUNTER — Other Ambulatory Visit: Payer: Self-pay | Admitting: Nurse Practitioner

## 2017-11-21 DIAGNOSIS — R519 Headache, unspecified: Secondary | ICD-10-CM

## 2017-11-21 DIAGNOSIS — R51 Headache: Principal | ICD-10-CM

## 2017-11-25 ENCOUNTER — Encounter: Payer: Self-pay | Admitting: Neurology

## 2018-01-07 ENCOUNTER — Ambulatory Visit: Payer: Managed Care, Other (non HMO) | Admitting: Neurology

## 2018-01-07 ENCOUNTER — Encounter: Payer: Self-pay | Admitting: Neurology

## 2018-01-07 DIAGNOSIS — Z029 Encounter for administrative examinations, unspecified: Secondary | ICD-10-CM

## 2018-01-07 NOTE — Progress Notes (Deleted)
NEUROLOGY CONSULTATION NOTE  Aaron Mccoy MRN: [id number] DOB: 06-19-1975  Referring provider: Katheran Awe, FNP Primary care provider: ***  Reason for consult:  headache  HISTORY OF PRESENT ILLNESS: Aaron Mccoy is a 42 year old ***-handed male with hypertension and history of atrial fibrillation who presents for headache.  History supplemented by referring providers note.  Onset:  *** Location:  *** Quality:  *** Intensity:  ***.  *** denies new headache, thunderclap headache or severe headache that wakes *** from sleep. Aura:  *** Prodrome:  *** Postdrome:  *** Associated symptoms:  ***.  *** denies associated unilateral numbness or weakness. Duration:  *** Frequency:  *** Frequency of abortive medication: *** Triggers:  *** Exacerbating factors:  *** Relieving factors:  *** Activity:  ***  Current NSAIDS:  Aspirin 62m daily Current analgesics:  *** Current triptans:  *** Current ergotamine:  *** Current anti-emetic:  *** Current muscle relaxants:  *** Current anti-anxiolytic:  *** Current sleep aide:  *** Current Antihypertensive medications: Metoprolol succinate ER 25 mg daily Current Antidepressant medications:  *** Current Anticonvulsant medications:  *** Current anti-CGRP:  *** Current Vitamins/Herbal/Supplements:  *** Current Antihistamines/Decongestants:  *** Other therapy:  *** Other medication: Chantix  Past NSAIDS:  *** Past analgesics:  *** Past abortive triptans:  *** Past abortive ergotamine:  *** Past muscle relaxants:  *** Past anti-emetic:  *** Past antihypertensive medications:  *** Past antidepressant medications:  *** Past anticonvulsant medications:  *** Past anti-CGRP:  *** Past vitamins/Herbal/Supplements:  *** Past antihistamines/decongestants:  *** Other past therapies:  ***  Caffeine:  *** Alcohol:  *** Smoker:  *** Diet:  *** Exercise:  *** Depression:  ***; Anxiety:  *** Other pain:  *** Sleep hygiene:   *** Family history of headache:  ***  PAST MEDICAL HISTORY: Past Medical History:  Diagnosis Date  . A-fib ([place]   . Hypertension   . Irregular heartbeat     PAST SURGICAL HISTORY: No past surgical history on file.  MEDICATIONS: Current Outpatient Medications on File Prior to Visit  Medication Sig Dispense Refill  . Aspirin-Salicylamide-Caffeine (BC HEADACHE POWDER PO) Take 1-2 packets by mouth every 6 (six) hours as needed (for pain).    . cyclobenzaprine (FLEXERIL) 10 MG tablet Take 1 tablet (10 mg total) by mouth 2 (two) times daily as needed for muscle spasms. 20 tablet 0  . diclofenac (VOLTAREN) 50 MG EC tablet Take 1 tablet (50 mg total) by mouth 2 (two) times daily. 15 tablet 0  . hydrochlorothiazide (HYDRODIURIL) 25 MG tablet Take 1 tablet (25 mg total) by mouth daily. (Patient not taking: Reported on 04/23/2016) 30 tablet 1  . metoprolol succinate (TOPROL-XL) 25 MG 24 hr tablet Take 25 mg by mouth daily.    . rivaroxaban (XARELTO) 20 MG TABS tablet Take 20 mg by mouth daily with supper.     No current facility-administered medications on file prior to visit.     ALLERGIES: No Known Allergies  FAMILY HISTORY: Mother: Heart disease Father:  Prostate cancer Maternal grandmother: Heart disease  SOCIAL HISTORY: Social History   Socioeconomic History  . Marital status: Married    Spouse name: Not on file  . Number of children: Not on file  . Years of education: Not on file  . Highest education level: Not on file  Occupational History  . Not on file  Social Needs  . Financial resource strain: Not on file  . Food insecurity:    Worry: Not on file  Inability: Not on file  . Transportation needs:    Medical: Not on file    Non-medical: Not on file  Tobacco Use  . Smoking status: Current Every Day Smoker    Packs/day: 1.00    Types: Cigarettes  . Smokeless tobacco: Never Used  Substance and Sexual Activity  . Alcohol use: Yes  . Drug use: Yes    Types:  Cocaine  . Sexual activity: Not on file  Lifestyle  . Physical activity:    Days per week: Not on file    Minutes per session: Not on file  . Stress: Not on file  Relationships  . Social connections:    Talks on phone: Not on file    Gets together: Not on file    Attends religious service: Not on file    Active member of club or organization: Not on file    Attends meetings of clubs or organizations: Not on file    Relationship status: Not on file  . Intimate partner violence:    Fear of current or ex partner: Not on file    Emotionally abused: Not on file    Physically abused: Not on file    Forced sexual activity: Not on file  Other Topics Concern  . Not on file  Social History Narrative  . Not on file    REVIEW OF SYSTEMS: Constitutional: No fevers, chills, or sweats, no generalized fatigue, change in appetite Eyes: No visual changes, double vision, eye pain Ear, nose and throat: No hearing loss, ear pain, nasal congestion, sore throat Cardiovascular: No chest pain, palpitations Respiratory:  No shortness of breath at rest or with exertion, wheezes GastrointestinaI: No nausea, vomiting, diarrhea, abdominal pain, fecal incontinence Genitourinary:  No dysuria, urinary retention or frequency Musculoskeletal:  No neck pain, back pain Integumentary: No rash, pruritus, skin lesions Neurological: as above Psychiatric: No depression, insomnia, anxiety Endocrine: No palpitations, fatigue, diaphoresis, mood swings, change in appetite, change in weight, increased thirst Hematologic/Lymphatic:  No purpura, petechiae. Allergic/Immunologic: no itchy/runny eyes, nasal congestion, recent allergic reactions, rashes  PHYSICAL EXAM: *** General: No acute distress.  Patient appears ***-groomed.  *** Head:  Normocephalic/atraumatic Eyes:  fundi examined but not visualized Neck: supple, no paraspinal tenderness, full range of motion Back: No paraspinal tenderness Heart: regular rate and  rhythm Lungs: Clear to auscultation bilaterally. Vascular: No carotid bruits. Neurological Exam: Mental status: alert and oriented to person, place, and time, recent and remote memory intact, fund of knowledge intact, attention and concentration intact, speech fluent and not dysarthric, language intact. Cranial nerves: CN I: not tested CN II: pupils equal, round and reactive to light, visual fields intact CN III, IV, VI:  full range of motion, no nystagmus, no ptosis CN V: facial sensation intact CN VII: upper and lower face symmetric CN VIII: hearing intact CN IX, X: gag intact, uvula midline CN XI: sternocleidomastoid and trapezius muscles intact CN XII: tongue midline Bulk & Tone: normal, no fasciculations. Motor:  5/5 throughout *** Sensation:  Pinprick *** temperature *** and vibration sensation intact.  ***. Deep Tendon Reflexes:  2+ throughout, *** toes downgoing.  *** Finger to nose testing:  Without dysmetria.  *** Heel to shin:  Without dysmetria.  *** Gait:  Normal station and stride.  Able to turn and tandem walk. Romberg ***.  IMPRESSION: ***  PLAN: ***  Thank you for allowing me to take part in the care of this patient.  Metta Clines, DO  CC: Katheran Awe, FNP

## 2019-10-11 IMAGING — CR DG LUMBAR SPINE COMPLETE 4+V
5 series · 5 of 5 positions shown · non-contrast
Comparison: None.

CLINICAL DATA: 41-year-old male with a history of fall and lower
back pain

EXAM:
LUMBAR SPINE - COMPLETE 4+ VIEW

[t lumbar spine ap]
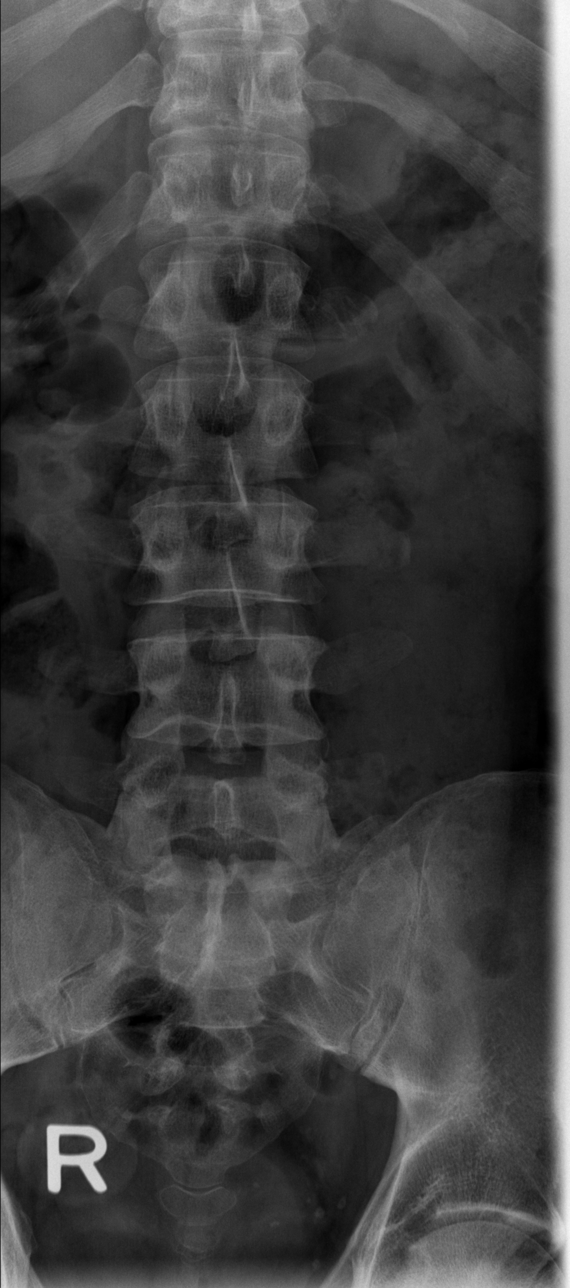

[t lumbar spine obl (1 of 2)]
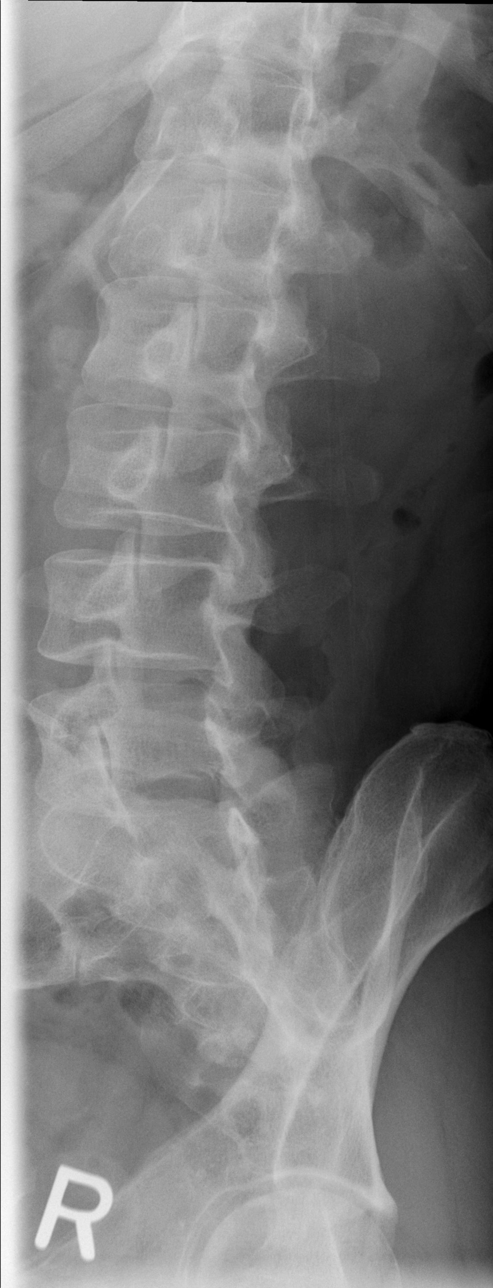

[t lumbar spine obl (2 of 2)]
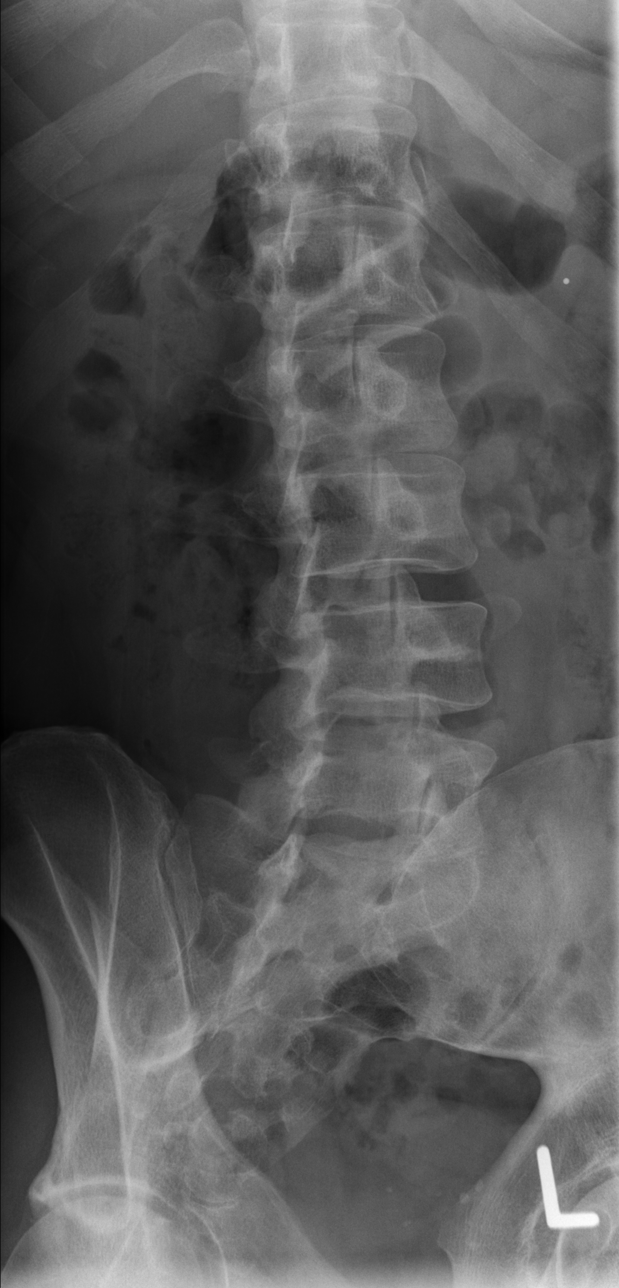

[t lumbar spine lat]
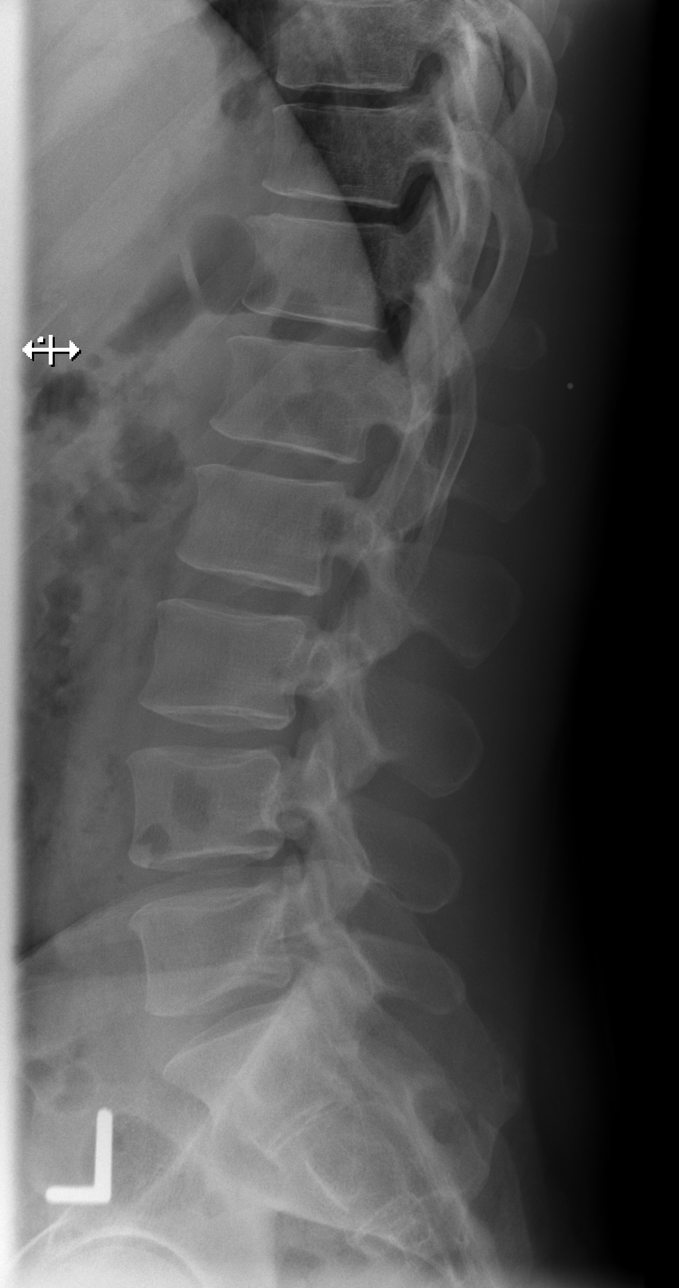

[t lumbar l-5 s-1 spot]
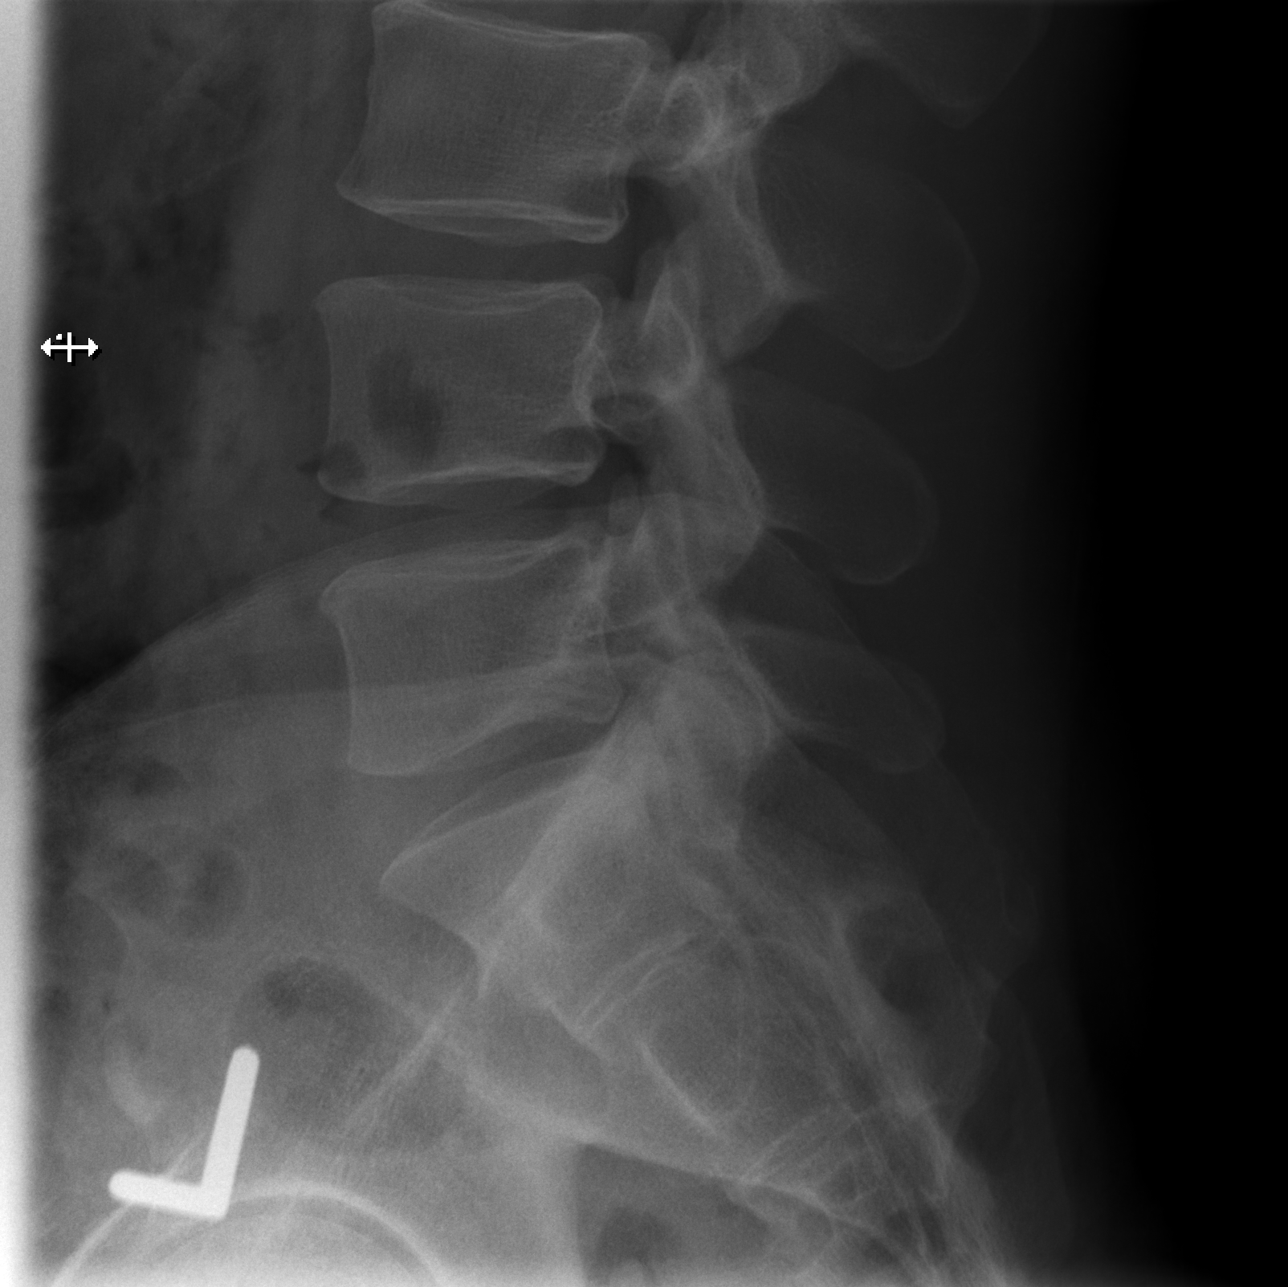

[5 of 5 positions shown; findings below may reference images not displayed]

FINDINGS: Lumbar Spine:

Lumbar vertebral elements maintain normal alignment without evidence
of anterolisthesis, retrolisthesis, subluxation.

No fracture line identified. Vertebral body heights maintained as
well as disc space heights.

No significant degenerative disc disease or endplate changes. No
significant facet changes.

Unremarkable appearance of the visualized abdomen.

Oblique images demonstrate no displaced pars defect.
IMPRESSION: Negative for acute fracture malalignment of the lumbar spine.

## 2020-05-25 LAB — COLOGUARD: COLOGUARD: NEGATIVE

## 2020-09-23 ENCOUNTER — Emergency Department (HOSPITAL_COMMUNITY)
Admission: EM | Admit: 2020-09-23 | Discharge: 2020-09-24 | Discharge status: Against Medical Advice | Payer: 59 | Attending: Student | Admitting: Student

## 2020-09-23 ENCOUNTER — Other Ambulatory Visit: Payer: Self-pay

## 2020-09-23 ENCOUNTER — Encounter (HOSPITAL_COMMUNITY): Payer: Self-pay | Admitting: Emergency Medicine

## 2020-09-23 ENCOUNTER — Encounter (HOSPITAL_COMMUNITY): Payer: Self-pay

## 2020-09-23 ENCOUNTER — Ambulatory Visit (HOSPITAL_COMMUNITY)
Admission: EM | Admit: 2020-09-23 | Discharge: 2020-09-23 | Disposition: A | Discharge status: Home / Self Care | Payer: 59

## 2020-09-23 DIAGNOSIS — I10 Essential (primary) hypertension: Secondary | ICD-10-CM | POA: Insufficient documentation

## 2020-09-23 DIAGNOSIS — R519 Headache, unspecified: Secondary | ICD-10-CM | POA: Diagnosis present

## 2020-09-23 DIAGNOSIS — Z5321 Procedure and treatment not carried out due to patient leaving prior to being seen by health care provider: Secondary | ICD-10-CM | POA: Diagnosis not present

## 2020-09-23 LAB — CBC WITH DIFFERENTIAL/PLATELET
Abs Immature Granulocytes: 0.03 10*3/uL (ref 0.00–0.07)
Basophils Absolute: 0 10*3/uL (ref 0.0–0.1)
Basophils Relative: 1 %
Eosinophils Absolute: 0.1 10*3/uL (ref 0.0–0.5)
Eosinophils Relative: 1 %
HCT: 43 % (ref 39.0–52.0)
Hemoglobin: 13.9 g/dL (ref 13.0–17.0)
Immature Granulocytes: 0 %
Lymphocytes Relative: 26 %
Lymphs Abs: 2.1 10*3/uL (ref 0.7–4.0)
MCH: 28.3 pg (ref 26.0–34.0)
MCHC: 32.3 g/dL (ref 30.0–36.0)
MCV: 87.6 fL (ref 80.0–100.0)
Monocytes Absolute: 0.5 10*3/uL (ref 0.1–1.0)
Monocytes Relative: 7 %
Neutro Abs: 5.3 10*3/uL (ref 1.7–7.7)
Neutrophils Relative %: 65 %
Platelets: 217 10*3/uL (ref 150–400)
RBC: 4.91 MIL/uL (ref 4.22–5.81)
RDW: 16.2 % — ABNORMAL HIGH (ref 11.5–15.5)
WBC: 8.1 10*3/uL (ref 4.0–10.5)
nRBC: 0 % (ref 0.0–0.2)

## 2020-09-23 LAB — BASIC METABOLIC PANEL
Anion gap: 8 (ref 5–15)
BUN: 12 mg/dL (ref 6–20)
CO2: 26 mmol/L (ref 22–32)
Calcium: 9.5 mg/dL (ref 8.9–10.3)
Chloride: 107 mmol/L (ref 98–111)
Creatinine, Ser: 1.27 mg/dL — ABNORMAL HIGH (ref 0.61–1.24)
GFR, Estimated: >60 mL/min (ref >60)
Glucose, Bld: 87 mg/dL (ref 70–99)
Potassium: 3.8 mmol/L (ref 3.5–5.1)
Sodium: 141 mmol/L (ref 135–145)

## 2020-09-23 NOTE — ED Provider Notes (Signed)
Emergency Medicine Provider Triage Evaluation Note  Aaron Mccoy , a 44 y.o. male  was evaluated in triage.  Pt complains of headache since this morning.  Started gradually, but worsening.  History of A. fib, not on anticoagulation.  History of high blood pressure, not medically compliant.  Has not hit his head on anything, history of headaches, but this feels worse than normal.  He is taking Tylenol and Aleve without relief..  Review of Systems  Positive: headache Negative: Nausea, vomiting, photophobia, photophobia, vision changes   Physical Exam  There were no vitals taken for this visit. Gen:   Awake, no distress   Resp:  Normal effort  MSK:   Moves extremities without difficulty  Other:  CN 3-12 grossly in tact  Medical Decision Making  Medically screening exam initiated at 7:10 PM.  Appropriate orders placed.  Aaron Mccoy was informed that the remainder of the evaluation will be completed by another provider, this initial triage assessment does not replace that evaluation, and the importance of remaining in the ED until their evaluation is complete.     [name], PA-C 94/17/40 [id number]    Delora Fuel, MD 81/85/63 [id number]

## 2020-09-23 NOTE — ED Triage Notes (Signed)
C/o severe headache onset this morning. States taking tylenol and ibuprofen without relief. History a fib and HTN. Noncompliant with HTN medication. No recent head trauma. Denies NV, vision change, HX migraines

## 2020-09-23 NOTE — ED Notes (Signed)
Pt left due to not being seen quick enough

## 2020-09-23 NOTE — ED Triage Notes (Signed)
Pt states having severe headache today. Interventions: motrin- not helpful

## 2020-09-23 NOTE — ED Notes (Signed)
Patient is being discharged from the Urgent Care and sent to the Emergency Department via personal vehical . Per provider, patient is in need of higher level of care due to patient condition, and medical history. Patient is aware and verbalizes understanding of plan of care.  Vitals:   09/23/20 1849  BP: (!) 144/94  Pulse: 100  Resp: 18  Temp: 98.8 F (37.1 C)  SpO2: 98%

## 2022-06-28 ENCOUNTER — Ambulatory Visit (INDEPENDENT_AMBULATORY_CARE_PROVIDER_SITE_OTHER): Payer: 59 | Admitting: Primary Care

## 2022-06-28 ENCOUNTER — Encounter (INDEPENDENT_AMBULATORY_CARE_PROVIDER_SITE_OTHER): Payer: Self-pay | Admitting: Primary Care

## 2022-06-28 VITALS — BP 135/94 | HR 89 | Resp 16 | Ht 67.0 in | Wt 218.0 lb

## 2022-06-28 DIAGNOSIS — E6609 Other obesity due to excess calories: Secondary | ICD-10-CM | POA: Diagnosis not present

## 2022-06-28 DIAGNOSIS — R351 Nocturia: Secondary | ICD-10-CM | POA: Diagnosis not present

## 2022-06-28 DIAGNOSIS — Z1159 Encounter for screening for other viral diseases: Secondary | ICD-10-CM

## 2022-06-28 DIAGNOSIS — F17290 Nicotine dependence, other tobacco product, uncomplicated: Secondary | ICD-10-CM

## 2022-06-28 DIAGNOSIS — Z13220 Encounter for screening for lipoid disorders: Secondary | ICD-10-CM

## 2022-06-28 DIAGNOSIS — I10 Essential (primary) hypertension: Secondary | ICD-10-CM

## 2022-06-28 DIAGNOSIS — Z131 Encounter for screening for diabetes mellitus: Secondary | ICD-10-CM

## 2022-06-28 DIAGNOSIS — Z114 Encounter for screening for human immunodeficiency virus [HIV]: Secondary | ICD-10-CM

## 2022-06-28 DIAGNOSIS — Z6834 Body mass index (BMI) 34.0-34.9, adult: Secondary | ICD-10-CM

## 2022-06-28 DIAGNOSIS — Z7689 Persons encountering health services in other specified circumstances: Secondary | ICD-10-CM

## 2022-06-28 MED ORDER — DILTIAZEM HCL ER COATED BEADS 120 MG PO CP24: 120.0000 mg | ORAL_CAPSULE | Freq: Every day | ORAL | 1 refills | Status: DC

## 2022-06-28 MED ORDER — HYDROCHLOROTHIAZIDE 12.5 MG PO TABS: 12.5000 mg | ORAL_TABLET | Freq: Every day | ORAL | 1 refills | Status: DC

## 2022-06-28 NOTE — Progress Notes (Signed)
New Patient Office Visit  Subjective    Patient ID: Aaron Mccoy, male    DOB: 1975/07/26  Age: 47 y.o. MRN: [id number]  CC:  Chief Complaint  Patient presents with   New Patient (Initial Visit)   Gastroesophageal Reflux    HPI Mr. SHIGEO BAUGH is a 47 year old obese male presents to establish care. He voices no c/o or concerns.   Outpatient Encounter Medications as of 06/28/2022  Medication Sig   diltiazem (CARDIZEM CD) 120 MG 24 hr capsule Take 120 mg by mouth daily.   Aspirin-Salicylamide-Caffeine (BC HEADACHE POWDER PO) Take 1-2 packets by mouth every 6 (six) hours as needed (for pain). (Patient not taking: Reported on 06/28/2022)   cyclobenzaprine (FLEXERIL) 10 MG tablet Take 1 tablet (10 mg total) by mouth 2 (two) times daily as needed for muscle spasms. (Patient not taking: Reported on 06/28/2022)   diclofenac (VOLTAREN) 50 MG EC tablet Take 1 tablet (50 mg total) by mouth 2 (two) times daily. (Patient not taking: Reported on 06/28/2022)   hydrochlorothiazide (HYDRODIURIL) 25 MG tablet Take 1 tablet (25 mg total) by mouth daily. (Patient not taking: Reported on 06/28/2022)   metoprolol succinate (TOPROL-XL) 25 MG 24 hr tablet Take 25 mg by mouth daily. (Patient not taking: Reported on 06/28/2022)   rivaroxaban (XARELTO) 20 MG TABS tablet Take 20 mg by mouth daily with supper. (Patient not taking: Reported on 06/28/2022)   No facility-administered encounter medications on file as of 06/28/2022.    Past Medical History:  Diagnosis Date   A-fib    Hypertension    Irregular heartbeat     No past surgical history on file.  No family history on file.  Social History   Socioeconomic History   Marital status: Married    Spouse name: Not on file   Number of children: Not on file   Years of education: Not on file   Highest education level: Not on file  Occupational History   Not on file  Tobacco Use   Smoking status: Every Day    Packs/day: 1    Types:  Cigarettes, Cigars   Smokeless tobacco: Never  Vaping Use   Vaping Use: Never used  Substance and Sexual Activity   Alcohol use: Yes   Drug use: Not Currently    Types: Cocaine   Sexual activity: Not on file  Other Topics Concern   Not on file  Social History Narrative   Not on file   Social Determinants of Health   Financial Resource Strain: Not on file  Food Insecurity: Not on file  Transportation Needs: Not on file  Physical Activity: Not on file  Stress: Not on file  Social Connections: Not on file  Intimate Partner Violence: Not on file    ROS Comprehensive ROS Pertinent positive and negative noted in HPI       Objective    Blood Pressure (Abnormal) 135/94 (BP Location: Right Arm, Patient Position: Sitting, Cuff Size: Large)   Pulse 89   Respiration 16   Height  (1.702 m)   Weight 218 lb (98.9 kg)   Oxygen Saturation 99%   Body Mass Index 34.14 kg/m   Physical exam: General: Vital signs reviewed.  Patient is well-developed and well-nourished, obese male in no acute distress and cooperative with exam. Head: Normocephalic and atraumatic. Eyes: EOMI, conjunctivae normal, no scleral icterus. Neck: Supple, trachea midline, normal ROM, no JVD, masses, thyromegaly, or carotid bruit present. Cardiovascular: RRR, S1  normal, S2 normal, no murmurs, gallops, or rubs. Pulmonary/Chest: Clear to auscultation bilaterally, no wheezes, rales, or rhonchi. Abdominal: Soft, non-tender, non-distended, BS +, no masses, organomegaly, or guarding present. Musculoskeletal: No joint deformities, erythema, or stiffness, ROM full and nontender. Extremities: No lower extremity edema bilaterally,  pulses symmetric and intact bilaterally. No cyanosis or clubbing. Neurological: A&O x3, Strength is normal Skin: Warm, dry and intact. No rashes or erythema. Psychiatric: Normal mood and affect. speech and behavior is normal. Cognition and memory are normal.    Assessment & Plan:  Traci  was seen today for new patient (initial visit) and gastroesophageal reflux.  Diagnoses and all orders for this visit:  Encounter to establish care  Class 1 obesity due to excess calories without serious comorbidity with body mass index (BMI) of 34.0 to 34.9 in adult Obesity is 30-39 indicating an excess in caloric intake or underlining conditions. This may lead to other co-morbidities. Educated on lifestyle modifications of diet and exercise which may reduce obesity.    Essential hypertension BP goal - < 130/80 Explained that having normal blood pressure is the goal and medications are helping to get to goal and maintain normal blood pressure. DIET: Limit salt intake, read nutrition labels to check salt content, limit fried and high fatty foods  Avoid using multisymptom OTC cold preparations that generally contain sudafed which can rise BP. Consult with pharmacist on best cold relief products to use for persons with HTN EXERCISE Discussed incorporating exercise such as walking - 30 minutes most days of the week and can do in 10 minute intervals    -     CMP14+EGFR  Diabetes mellitus screening -     CBC with Differential -     Hemoglobin A1c  Lipid screening  Healthy lifestyle diet of fruits vegetables fish nuts whole grains and low saturated fat . Foods high in cholesterol or liver, fatty meats,cheese, butter avocados, nuts and seeds, chocolate and fried foods.  Encounter for screening for HIV -     HIV Antibody (routine testing w rflx)  Encounter for HCV screening test for low risk patient -     HCV Ab w Reflex to Quant PCR  Nocturia -     PSA  Other orders -     diltiazem (CARDIZEM CD) 120 MG 24 hr capsule; Take 1 capsule (120 mg total) by mouth daily. -     hydrochlorothiazide (HYDRODIURIL) 12.5 MG tablet; Take 1 tablet (12.5 mg total) by mouth daily.    Grayce Sessions, NP

## 2022-06-28 NOTE — Patient Instructions (Signed)
Hypertension, Adult ?Hypertension is another name for high blood pressure. High blood pressure forces your heart to work harder to pump blood. This can cause problems over time. ?There are two numbers in a blood pressure reading. There is a top number (systolic) over a bottom number (diastolic). It is best to have a blood pressure that is below 120/80. ?What are the causes? ?The cause of this condition is not known. Some other conditions can lead to high blood pressure. ?What increases the risk? ?Some lifestyle factors can make you more likely to develop high blood pressure: ?Smoking. ?Not getting enough exercise or physical activity. ?Being overweight. ?Having too much fat, sugar, calories, or salt (sodium) in your diet. ?Drinking too much alcohol. ?Other risk factors include: ?Having any of these conditions: ?Heart disease. ?Diabetes. ?High cholesterol. ?Kidney disease. ?Obstructive sleep apnea. ?Having a family history of high blood pressure and high cholesterol. ?Age. The risk increases with age. ?Stress. ?What are the signs or symptoms? ?High blood pressure may not cause symptoms. Very high blood pressure (hypertensive crisis) may cause: ?Headache. ?Fast or uneven heartbeats (palpitations). ?Shortness of breath. ?Nosebleed. ?Vomiting or feeling like you may vomit (nauseous). ?Changes in how you see. ?Very bad chest pain. ?Feeling dizzy. ?Seizures. ?How is this treated? ?This condition is treated by making healthy lifestyle changes, such as: ?Eating healthy foods. ?Exercising more. ?Drinking less alcohol. ?Your doctor may prescribe medicine if lifestyle changes do not help enough and if: ?Your top number is above 130. ?Your bottom number is above 80. ?Your personal target blood pressure may vary. ?Follow these instructions at home: ?Eating and drinking ? ?If told, follow the DASH eating plan. To follow this plan: ?Fill one half of your plate at each meal with fruits and vegetables. ?Fill one fourth of your plate  at each meal with whole grains. Whole grains include whole-wheat pasta, brown rice, and whole-grain bread. ?Eat or drink low-fat dairy products, such as skim milk or low-fat yogurt. ?Fill one fourth of your plate at each meal with low-fat (lean) proteins. Low-fat proteins include fish, chicken without skin, eggs, beans, and tofu. ?Avoid fatty meat, cured and processed meat, or chicken with skin. ?Avoid pre-made or processed food. ?Limit the amount of salt in your diet to less than 1,500 mg each day. ?Do not drink alcohol if: ?Your doctor tells you not to drink. ?You are pregnant, may be pregnant, or are planning to become pregnant. ?If you drink alcohol: ?Limit how much you have to: ?0-1 drink a day for women. ?0-2 drinks a day for men. ?Know how much alcohol is in your drink. In the [place] one drink equals one 12 oz bottle of beer (355 mL), one 5 oz glass of wine (148 mL), or one 1? oz glass of hard liquor (44 mL). ?Lifestyle ? ?Work with your doctor to stay at a healthy weight or to lose weight. Ask your doctor what the best weight is for you. ?Get at least 30 minutes of exercise that causes your heart to beat faster (aerobic exercise) most days of the week. This may include walking, swimming, or biking. ?Get at least 30 minutes of exercise that strengthens your muscles (resistance exercise) at least 3 days a week. This may include lifting weights or doing Pilates. ?Do not smoke or use any products that contain nicotine or tobacco. If you need help quitting, ask your doctor. ?Check your blood pressure at home as told by your doctor. ?Keep all follow-up visits. ?Medicines ?Take over-the-counter and prescription medicines   only as told by your doctor. Follow directions carefully. ?Do not skip doses of blood pressure medicine. The medicine does not work as well if you skip doses. Skipping doses also puts you at risk for problems. ?Ask your doctor about side effects or reactions to medicines that you should watch  for. ?Contact a doctor if: ?You think you are having a reaction to the medicine you are taking. ?You have headaches that keep coming back. ?You feel dizzy. ?You have swelling in your ankles. ?You have trouble with your vision. ?Get help right away if: ?You get a very bad headache. ?You start to feel mixed up (confused). ?You feel weak or numb. ?You feel faint. ?You have very bad pain in your: ?Chest. ?Belly (abdomen). ?You vomit more than once. ?You have trouble breathing. ?These symptoms may be an emergency. Get help right away. Call 911. ?Do not wait to see if the symptoms will go away. ?Do not drive yourself to the hospital. ?Summary ?Hypertension is another name for high blood pressure. ?High blood pressure forces your heart to work harder to pump blood. ?For most people, a normal blood pressure is less than 120/80. ?Making healthy choices can help lower blood pressure. If your blood pressure does not get lower with healthy choices, you may need to take medicine. ?This information is not intended to replace advice given to you by your health care provider. Make sure you discuss any questions you have with your health care provider. ?Document Revised: 12/08/2020 Document Reviewed: 12/08/2020 ?Elsevier Patient Education ? 2023 Elsevier Inc. ? ?

## 2022-06-29 LAB — CBC WITH DIFFERENTIAL/PLATELET
Basophils Absolute: 0.1 10*3/uL (ref 0.0–0.2)
Basos: 1 %
EOS (ABSOLUTE): 0.2 10*3/uL (ref 0.0–0.4)
Eos: 2 %
Hematocrit: 44.5 % (ref 37.5–51.0)
Hemoglobin: 14.6 g/dL (ref 13.0–17.7)
Immature Grans (Abs): 0 10*3/uL (ref 0.0–0.1)
Immature Granulocytes: 0 %
Lymphocytes Absolute: 2.4 10*3/uL (ref 0.7–3.1)
Lymphs: 35 %
MCH: 27.3 pg (ref 26.6–33.0)
MCHC: 32.8 g/dL (ref 31.5–35.7)
MCV: 83 fL (ref 79–97)
Monocytes Absolute: 0.5 10*3/uL (ref 0.1–0.9)
Monocytes: 6 %
Neutrophils Absolute: 3.9 10*3/uL (ref 1.4–7.0)
Neutrophils: 56 %
Platelets: 274 10*3/uL (ref 150–450)
RBC: 5.34 x10E6/uL (ref 4.14–5.80)
RDW: 15.6 % — ABNORMAL HIGH (ref 11.6–15.4)
WBC: 7 10*3/uL (ref 3.4–10.8)

## 2022-06-29 LAB — HCV INTERPRETATION

## 2022-06-29 LAB — CMP14+EGFR
ALT: 28 IU/L (ref 0–44)
AST: 26 IU/L (ref 0–40)
Albumin/Globulin Ratio: 1.7 (ref 1.2–2.2)
Albumin: 4.9 g/dL (ref 4.1–5.1)
Alkaline Phosphatase: 86 IU/L (ref 44–121)
BUN/Creatinine Ratio: 13 (ref 9–20)
BUN: 16 mg/dL (ref 6–24)
Bilirubin Total: 0.7 mg/dL (ref 0.0–1.2)
CO2: 22 mmol/L (ref 20–29)
Calcium: 10 mg/dL (ref 8.7–10.2)
Chloride: 104 mmol/L (ref 96–106)
Creatinine, Ser: 1.22 mg/dL (ref 0.76–1.27)
Globulin, Total: 2.9 g/dL (ref 1.5–4.5)
Glucose: 81 mg/dL (ref 70–99)
Potassium: 4.8 mmol/L (ref 3.5–5.2)
Sodium: 142 mmol/L (ref 134–144)
Total Protein: 7.8 g/dL (ref 6.0–8.5)
eGFR: 73 mL/min/{1.73_m2} (ref >59)

## 2022-06-29 LAB — HEMOGLOBIN A1C
Est. average glucose Bld gHb Est-mCnc: 128 mg/dL
Hgb A1c MFr Bld: 6.1 % — ABNORMAL HIGH (ref 4.8–5.6)

## 2022-06-29 LAB — HIV ANTIBODY (ROUTINE TESTING W REFLEX): HIV Screen 4th Generation wRfx: NONREACTIVE

## 2022-06-29 LAB — HCV AB W REFLEX TO QUANT PCR: HCV Ab: NONREACTIVE

## 2022-06-29 LAB — PSA: Prostate Specific Ag, Serum: 0.7 ng/mL (ref 0.0–4.0)

## 2022-07-26 ENCOUNTER — Encounter (INDEPENDENT_AMBULATORY_CARE_PROVIDER_SITE_OTHER): Payer: Self-pay | Admitting: Primary Care

## 2022-07-26 ENCOUNTER — Ambulatory Visit (INDEPENDENT_AMBULATORY_CARE_PROVIDER_SITE_OTHER): Payer: Managed Care, Other (non HMO) | Admitting: Primary Care

## 2022-07-26 VITALS — BP 140/97 | HR 82 | Resp 16 | Wt 218.4 lb

## 2022-07-26 DIAGNOSIS — Z0130 Encounter for examination of blood pressure without abnormal findings: Secondary | ICD-10-CM | POA: Diagnosis not present

## 2022-07-30 NOTE — Progress Notes (Signed)
[facility]   Aaron Mccoy is a 47 y.o. male presents for hypertension evaluation, Denies shortness of breath, headaches, chest pain or lower extremity edema, sudden onset, vision changes, unilateral weakness, dizziness, paresthesias   Patient reports adherence with medications.  Dietary habits include: Monitoring sodium intake Exercise habits include: Walks Family / Social history: Unknown   Past Medical History:  Diagnosis Date   A-fib (HCC)    Hypertension    Irregular heartbeat    No past surgical history on file. No Known Allergies Current Outpatient Medications on File Prior to Visit  Medication Sig Dispense Refill   Aspirin-Salicylamide-Caffeine (BC HEADACHE POWDER PO) Take 1-2 packets by mouth every 6 (six) hours as needed (for pain). (Patient not taking: Reported on 06/28/2022)     cyclobenzaprine (FLEXERIL) 10 MG tablet Take 1 tablet (10 mg total) by mouth 2 (two) times daily as needed for muscle spasms. (Patient not taking: Reported on 06/28/2022) 20 tablet 0   diclofenac (VOLTAREN) 50 MG EC tablet Take 1 tablet (50 mg total) by mouth 2 (two) times daily. (Patient not taking: Reported on 06/28/2022) 15 tablet 0   diltiazem (CARDIZEM CD) 120 MG 24 hr capsule Take 1 capsule (120 mg total) by mouth daily. 90 capsule 1   hydrochlorothiazide (HYDRODIURIL) 12.5 MG tablet Take 1 tablet (12.5 mg total) by mouth daily. 90 tablet 1   metoprolol succinate (TOPROL-XL) 25 MG 24 hr tablet Take 25 mg by mouth daily. (Patient not taking: Reported on 06/28/2022)     rivaroxaban (XARELTO) 20 MG TABS tablet Take 20 mg by mouth daily with supper. (Patient not taking: Reported on 06/28/2022)     No current facility-administered medications on file prior to visit.   Social History   Socioeconomic History   Marital status: Married    Spouse name: Not on file   Number of children: Not on file   Years of education: Not on file   Highest education level: Not on file   Occupational History   Not on file  Tobacco Use   Smoking status: Every Day    Packs/day: 1    Types: Cigarettes, Cigars   Smokeless tobacco: Never  Vaping Use   Vaping Use: Never used  Substance and Sexual Activity   Alcohol use: Yes   Drug use: Not Currently    Types: Cocaine   Sexual activity: Not on file  Other Topics Concern   Not on file  Social History Narrative   Not on file   Social Determinants of Health   Financial Resource Strain: Not on file  Food Insecurity: Not on file  Transportation Needs: Not on file  Physical Activity: Not on file  Stress: Not on file  Social Connections: Not on file  Intimate Partner Violence: Not on file   No family history on file.   OBJECTIVE:  Vitals:   07/26/22 0900 07/26/22 0901  BP: (Abnormal) 141/96 (Abnormal) 140/97  Pulse: 82   Resp: 16   SpO2: 100%   Weight: 218 lb 6.4 oz (99.1 kg)     Physical Exam Vitals reviewed.  Constitutional:      Appearance: He is obese.  HENT:     Right Ear: External ear normal.     Left Ear: External ear normal.     Nose: Nose normal.  Cardiovascular:     Rate and Rhythm: Normal rate and regular rhythm.  Pulmonary:     Effort: Pulmonary effort is normal.     Breath sounds:  Normal breath sounds.  Abdominal:     General: There is distension.     Palpations: Abdomen is soft.  Musculoskeletal:        General: Normal range of motion.     Cervical back: Normal range of motion and neck supple.  Skin:    General: Skin is warm and dry.  Neurological:     Mental Status: He is oriented to person, place, and time.  Psychiatric:        Mood and Affect: Mood normal.    ROS  Last 3 Office BP readings: BP Readings from Last 3 Encounters:  07/26/22 (Abnormal) 140/97  06/28/22 (Abnormal) 135/94  09/23/20 130/90    BMET    Component Value Date/Time   NA 142 06/28/2022 1010   K 4.8 06/28/2022 1010   CL 104 06/28/2022 1010   CO2 22 06/28/2022 1010   GLUCOSE 81 06/28/2022 1010    GLUCOSE 87 09/23/2020 2036   BUN 16 06/28/2022 1010   CREATININE 1.22 06/28/2022 1010   CALCIUM 10.0 06/28/2022 1010   GFRNONAA >60 09/23/2020 2036   GFRAA >90 04/02/2013 1520    Renal function: CrCl cannot be calculated (Patient's most recent lab result is older than the maximum 21 days allowed.).  Clinical ASCVD:  The ASCVD Risk score (Arnett DK, et al., 2019) failed to calculate for the following reasons:   Cannot find a previous HDL lab   Cannot find a previous total cholesterol lab  ASCVD risk factors include- [place]   ASSESSMENT & PLAN:   [name] was seen today for blood pressure check.  Diagnoses and all orders for this visit:  BP check Patient did not take medication-explaining BP check is to determine if the medication is controlling your blood pressure or if adjustment is needed   -Counseled on lifestyle modifications for blood pressure control including reduced dietary sodium, increased exercise, weight reduction and adequate sleep. Also, educated patient about the risk for cardiovascular events, stroke and heart attack. Also counseled patient about the importance of medication adherence. If you participate in smoking, it is important to stop using tobacco as this will increase the risks associated with uncontrolled blood pressure.  Goal BP:  For patients younger than 60: Goal BP < 130/80. For patients 60 and older: Goal BP < 140/90. For patients with diabetes: Goal BP < 130/80. Your most recent BP: 140/97  Minimize salt intake. Minimize alcohol intake    This note has been created with Education officer, environmental. Any transcriptional errors are unintentional.   Grayce Sessions, NP 07/30/2022, 11:50 AM

## 2022-11-20 ENCOUNTER — Other Ambulatory Visit (INDEPENDENT_AMBULATORY_CARE_PROVIDER_SITE_OTHER): Payer: Self-pay | Admitting: Primary Care

## 2022-11-20 NOTE — Telephone Encounter (Signed)
Medication Refill - Medication: diltiazem (CARDIZEM CD) 120 MG 24 hr capsule   Has the patient contacted their pharmacy? No. (Agent: If no, request that the patient contact the pharmacy for the refill. If patient does not wish to contact the pharmacy document the reason why and proceed with request.) (Agent: If yes, when and what did the pharmacy advise?)  Preferred Pharmacy (with phone number or street name):  [facility] [phone] [place], [place] - [address] Phone: [phone]  Fax: [phone]     Has the patient been seen for an appointment in the last year OR does the patient have an upcoming appointment? Yes.    Agent: Please be advised that RX refills may take up to 3 business days. We ask that you follow-up with your pharmacy.

## 2022-11-21 MED ORDER — DILTIAZEM HCL ER COATED BEADS 120 MG PO CP24: 120.0000 mg | ORAL_CAPSULE | Freq: Every day | ORAL | 0 refills | Status: DC

## 2022-11-21 NOTE — Telephone Encounter (Signed)
Requested Prescriptions  Pending Prescriptions Disp Refills   diltiazem (CARDIZEM CD) 120 MG 24 hr capsule 90 capsule 1    Sig: Take 1 capsule (120 mg total) by mouth daily.     Cardiovascular: Calcium Channel Blockers 3 Failed - 11/20/2022  1:28 PM      Failed - Last BP in normal range    BP Readings from Last 1 Encounters:  07/26/22 (!) 140/97         Passed - ALT in normal range and within 360 days    ALT  Date Value Ref Range Status  06/28/2022 28 0 - 44 IU/L Final         Passed - AST in normal range and within 360 days    AST  Date Value Ref Range Status  06/28/2022 26 0 - 40 IU/L Final         Passed - Cr in normal range and within 360 days    Creatinine, Ser  Date Value Ref Range Status  06/28/2022 1.22 0.76 - 1.27 mg/dL Final         Passed - Last Heart Rate in normal range    Pulse Readings from Last 1 Encounters:  07/26/22 82         Passed - Valid encounter within last 6 months    Recent Outpatient Visits           3 months ago BP check   [place] [facility] Grayce Sessions, NP   4 months ago Encounter to establish care   [place] [facility] Grayce Sessions, NP   6 years ago Infected sebaceous cyst   [facility], Marolyn Hammock, PA-C   6 years ago Infected sebaceous cyst   [facility] [place], [place], [place]

## 2022-12-26 ENCOUNTER — Encounter (INDEPENDENT_AMBULATORY_CARE_PROVIDER_SITE_OTHER): Payer: Self-pay | Admitting: Primary Care

## 2022-12-26 ENCOUNTER — Ambulatory Visit (INDEPENDENT_AMBULATORY_CARE_PROVIDER_SITE_OTHER): Payer: 59 | Admitting: Primary Care

## 2022-12-26 VITALS — BP 142/96 | HR 91 | Resp 16 | Wt 221.8 lb

## 2022-12-26 DIAGNOSIS — Z1211 Encounter for screening for malignant neoplasm of colon: Secondary | ICD-10-CM | POA: Diagnosis not present

## 2022-12-26 DIAGNOSIS — Z2821 Immunization not carried out because of patient refusal: Secondary | ICD-10-CM

## 2022-12-26 DIAGNOSIS — I10 Essential (primary) hypertension: Secondary | ICD-10-CM | POA: Diagnosis not present

## 2022-12-26 DIAGNOSIS — Z13220 Encounter for screening for lipoid disorders: Secondary | ICD-10-CM

## 2022-12-26 MED ORDER — DILTIAZEM HCL ER COATED BEADS 120 MG PO CP24: 120.0000 mg | ORAL_CAPSULE | Freq: Every day | ORAL | 1 refills | Status: DC

## 2022-12-26 MED ORDER — HYDROCHLOROTHIAZIDE 12.5 MG PO TABS: 12.5000 mg | ORAL_TABLET | Freq: Every day | ORAL | 1 refills | Status: DC

## 2022-12-26 MED ORDER — METHOCARBAMOL 500 MG PO TABS: 500.0000 mg | ORAL_TABLET | Freq: Three times a day (TID) | ORAL | 1 refills | Status: DC | PRN

## 2022-12-26 NOTE — Patient Instructions (Signed)
Muscle Pain, Adult Muscle pain, also called myalgia, is a condition in which a person has pain in one or more muscles in the body. The pain may be mild, moderate, or severe. It may feel sharp, achy, or burning. In most cases, the pain lasts only a short time and goes away on its own. It is normal to feel some muscle pain after you start a new exercise program. Muscles that have not been used a lot will be sore at first. What are the causes? You may have muscle pain when you use your muscles in a new or different way after not having used them for some time. Muscle pain can also be caused by overuse or by stretching a muscle beyond its normal length (muscle strain). You may be more likely to have muscle pain if you are not in shape. Other causes may include: Injury or bruising. Infectious diseases. These include diseases caused by viruses, such as the flu (influenza). Fibromyalgia. This is a long-term (chronic) condition that causes muscle tenderness, tiredness (fatigue), and headache. Autoimmune or rheumatologic diseases. These are conditions, such as lupus, that cause the body's defense system (immune system) to attack areas in the body. Certain medicines. These include ACE inhibitors and statins. What are the signs or symptoms? The main symptom is sore or painful muscles. Your muscles may be sore when you do activities and when you stretch. You may also have slight swelling. How is this diagnosed? Muscle pain is diagnosed with a physical exam. Your health care provider will ask questions about your pain and when it began. If you have not had muscle pain for very long, your provider may want to wait before doing much testing. If your muscle pain has lasted a long time, tests may be done right away. In some cases, you may need tests to rule out other conditions and diseases. How is this treated? Treatment for muscle pain depends on the cause. Home care is usually enough to relieve the pain. Your  provider may also prescribe NSAIDs, such as ibuprofen. Follow these instructions at home: Medicines Take over-the-counter and prescription medicines only as told by your provider. Ask your health care provider if the medicine prescribed to you requires you to avoid driving or using machinery. Managing pain, swelling, and discomfort     If told, put ice on the painful area for the first 2 days of soreness. Put ice in a plastic bag. Place a towel between your skin and the bag. Leave the ice on for 20 minutes, 2-3 times a day. If your skin turns bright red, remove the ice right away to prevent skin damage. The risk of damage is higher if you cannot feel pain, heat, or cold. For the first 2 days of muscle soreness, or if there is swelling: Do not soak in hot baths. Do not use a hot tub, steam room, sauna, heating pad, or other heat source. After 2-3 days, you may switch between putting ice and heat on the area. If told, apply heat to the affected area as often as told by your provider. Use the heat source that your recommends, such as a moist heat pack or a heating pad. Place a towel between your skin and the heat source. Leave the heat on for 20-30 minutes. If your skin turns bright red, remove the ice or heat right away to prevent skin damage. The risk of damage is higher if you cannot feel pain, heat, or cold. If you have an injury,   raise (elevate) the injured area above the level of your heart while you are sitting or lying down. Activity  If your muscle pain is caused by overuse: Slow down your activities until the pain goes away. Do regular, gentle exercises if you are not normally active. Warm up before you exercise. Stretch before and after you exercise. This can help lower the risk of muscle pain. Do not keep working out if the pain is severe. Severe pain could mean that you have injured a muscle. You may have to avoid lifting. Ask your provider how much you can safely lift. Return  to your normal activities as told by your provider. Ask your provider what activities are safe for you. General instructions Do not use any products that contain nicotine or tobacco. These products include cigarettes, chewing tobacco, and vaping devices, such as e-cigarettes. If you need help quitting, ask your provider. Contact a health care provider if: Your muscle pain gets worse, and medicines do not help. The muscle pain lasts longer than 3 days. You have a rash or fever. You have muscle pain after a tick bite. You have muscle pain when you work out, even though you are in good shape. You have redness, soreness, or swelling. You have muscle pain after you start a new medicine or change the dose of a medicine. Get help right away if: You have trouble breathing. You have trouble swallowing. You have muscle pain along with a stiff neck, fever, and vomiting. You have severe muscle weakness, or you cannot move part of your body. You are urinating less, or you have dark, bloody, or discolored urine. You have redness or swelling at the site of the muscle pain. These symptoms may be an emergency. Get help right away. Call 911. Do not wait to see if the symptoms will go away. Do not drive yourself to the hospital. This information is not intended to replace advice given to you by your health care provider. Make sure you discuss any questions you have with your health care provider. Document Revised: 09/29/2021 Document Reviewed: 09/29/2021 Elsevier Patient Education  2024 [employer] Inc.  

## 2022-12-26 NOTE — Progress Notes (Signed)
[facility]  Aaron Mccoy, is a 47 y.o. male  JYN:[id number]  QMV:[id number]  DOB - March 29, 1975  Chief Complaint  Patient presents with   Flank Pain    Right side Started 2-3 weeks ago   Headache   Neck Pain    Stiffness  Started 2-3 weeks ago       Subjective:   Aaron Mccoy is a 47 y.o. male here today for acute visit he is c/o cervical, shoulder , right side numbness and low back pain. 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 .Patient has No headache, No chest pain, No abdominal pain - No Nausea, No new weakness tingling or numbness, No Cough - shortness of breath  No problems updated.  No Known Allergies  Past Medical History:  Diagnosis Date   A-fib (HCC)    Hypertension    Irregular heartbeat     No current outpatient medications on file prior to visit.   No current facility-administered medications on file prior to visit.    Objective:   Vitals:   12/26/22 0938 12/26/22 0940  BP: (!) 144/95 (!) 142/96  Pulse: 91   Resp: 16   SpO2: 99%   Weight: 221 lb 12.8 oz (100.6 kg)     Comprehensive ROS Pertinent positive and negative noted in HPI   Exam General appearance : Awake, alert, not in any distress. Speech Clear. Not toxic looking HEENT: Atraumatic and Normocephalic, pupils equally reactive to light and accomodation Neck: Supple, no JVD. No cervical lymphadenopathy.  Chest: Good air entry bilaterally, no added sounds  CVS: S1 S2 regular, no murmurs.  Abdomen: Bowel sounds present, Non tender and not distended with no gaurding, rigidity or rebound. Extremities: B/L Lower Ext shows no edema, both legs are warm to touch Neurology: Awake alert, and oriented X 3, CN II-XII intact, Non focal Skin: No Rash  Data Review Lab Results  Component Value Date   HGBA1C 6.1 (H) 06/28/2022    Assessment & Plan  Aaron Mccoy was seen today for flank pain,  headache and neck pain.  Diagnoses and all orders for this visit:  Influenza vaccination declined  Colon cancer screening -     Ambulatory referral to Gastroenterology  Essential hypertension BP goal - < 130/80 Explained that having normal blood pressure is the goal and medications are helping to get to goal and maintain normal blood pressure. DIET: Limit salt intake, read nutrition labels to check salt content, limit fried and high fatty foods  Avoid using multisymptom OTC cold preparations that generally contain sudafed which can rise BP. Consult with pharmacist on best cold relief products to use for persons with HTN EXERCISE Discussed incorporating exercise such as walking - 30 minutes most days of the week and can do in 10 minute intervals    -     diltiazem (CARDIZEM CD) 120 MG 24 hr capsule; Take 1 capsule (120 mg total) by mouth daily. -     hydrochlorothiazide (HYDRODIURIL) 12.5 MG tablet; Take 1 tablet (12.5 mg total) by mouth daily. -     Lipid Panel  Lipid screening -     Lipid Panel  Other orders -     methocarbamol (ROBAXIN) 500 MG tablet; Take 1 tablet (500 mg total) by mouth every 8 (eight) hours as needed for muscle spasms.     Patient have been counseled extensively about nutrition and exercise. Other issues discussed  during this visit include: low cholesterol diet, weight control and daily exercise, foot care, annual eye examinations at Ophthalmology, importance of adherence with medications and regular follow-up. We also discussed long term complications of uncontrolled diabetes and hypertension.   Return in about 3 months (around 03/28/2023).  The patient was given clear instructions to go to ER or return to medical center if symptoms don't improve, worsen or new problems develop. The patient verbalized understanding. The patient was told to call to get lab results if they haven't heard anything in the next week.   This note has been created with Engineer, agricultural. Any transcriptional errors are unintentional.   Grayce Sessions, NP 12/30/2022, 11:09 PM

## 2022-12-27 LAB — LIPID PANEL
Chol/HDL Ratio: 5 ratio (ref 0.0–5.0)
Cholesterol, Total: 190 mg/dL (ref 100–199)
HDL: 38 mg/dL — ABNORMAL LOW (ref >39)
LDL Chol Calc (NIH): 131 mg/dL — ABNORMAL HIGH (ref 0–99)
Triglycerides: 116 mg/dL (ref 0–149)
VLDL Cholesterol Cal: 21 mg/dL (ref 5–40)

## 2023-01-23 ENCOUNTER — Ambulatory Visit (INDEPENDENT_AMBULATORY_CARE_PROVIDER_SITE_OTHER): Payer: Managed Care, Other (non HMO)

## 2023-01-23 VITALS — BP 129/89

## 2023-01-23 DIAGNOSIS — Z0130 Encounter for examination of blood pressure without abnormal findings: Secondary | ICD-10-CM

## 2023-01-23 NOTE — Progress Notes (Signed)
   Blood Pressure Recheck Visit  Name: Aaron Mccoy MRN: [id number] Date of Birth: Aug 24, 1975  [name] presents today for Blood Pressure recheck with clinical support staff.    BP Readings from Last 3 Encounters:  01/23/23 129/89  12/26/22 (!) 142/96  07/26/22 (!) 140/97    Current Outpatient Medications  Medication Sig Dispense Refill   diltiazem (CARDIZEM CD) 120 MG 24 hr capsule Take 1 capsule (120 mg total) by mouth daily. 90 capsule 1   hydrochlorothiazide (HYDRODIURIL) 12.5 MG tablet Take 1 tablet (12.5 mg total) by mouth daily. 90 tablet 1   methocarbamol (ROBAXIN) 500 MG tablet Take 1 tablet (500 mg total) by mouth every 8 (eight) hours as needed for muscle spasms. 90 tablet 1   No current facility-administered medications for this visit.    Hypertensive Medication Review: Patient states that they are taking all their hypertensive medications as prescribed and their last dose of hypertensive medications was this morning at 7am.   Documentation of any medication adherence discrepancies: none  Provider Recommendation:  Spoke to [place] and she stated bp is much better continue current medications and enjoy the holidays we see him at his follow up   Patient has his 3 month f/u scheduled for 03/28/23 @ 9:10am  Patient has been given provider's recommendations and does not have any questions or concerns at this time. Patient will contact the office for any future questions or concerns.

## 2023-03-08 ENCOUNTER — Encounter: Payer: Self-pay | Admitting: Family Medicine

## 2023-03-08 ENCOUNTER — Ambulatory Visit (INDEPENDENT_AMBULATORY_CARE_PROVIDER_SITE_OTHER): Payer: Managed Care, Other (non HMO) | Admitting: Family Medicine

## 2023-03-08 VITALS — BP 102/82 | HR 80 | Temp 99.0°F | Ht 67.0 in | Wt 226.6 lb

## 2023-03-08 DIAGNOSIS — K219 Gastro-esophageal reflux disease without esophagitis: Secondary | ICD-10-CM

## 2023-03-08 DIAGNOSIS — M25521 Pain in right elbow: Secondary | ICD-10-CM | POA: Diagnosis not present

## 2023-03-08 DIAGNOSIS — I10 Essential (primary) hypertension: Secondary | ICD-10-CM

## 2023-03-08 DIAGNOSIS — I480 Paroxysmal atrial fibrillation: Secondary | ICD-10-CM | POA: Diagnosis not present

## 2023-03-08 DIAGNOSIS — F1910 Other psychoactive substance abuse, uncomplicated: Secondary | ICD-10-CM | POA: Insufficient documentation

## 2023-03-08 DIAGNOSIS — R079 Chest pain, unspecified: Secondary | ICD-10-CM

## 2023-03-08 DIAGNOSIS — Z7689 Persons encountering health services in other specified circumstances: Secondary | ICD-10-CM

## 2023-03-08 DIAGNOSIS — Z2821 Immunization not carried out because of patient refusal: Secondary | ICD-10-CM

## 2023-03-08 DIAGNOSIS — Z720 Tobacco use: Secondary | ICD-10-CM

## 2023-03-08 DIAGNOSIS — Z6835 Body mass index (BMI) 35.0-35.9, adult: Secondary | ICD-10-CM

## 2023-03-08 DIAGNOSIS — E7849 Other hyperlipidemia: Secondary | ICD-10-CM

## 2023-03-08 DIAGNOSIS — E6609 Other obesity due to excess calories: Secondary | ICD-10-CM

## 2023-03-08 MED ORDER — FAMOTIDINE 20 MG PO TABS: 20.0000 mg | ORAL_TABLET | Freq: Two times a day (BID) | ORAL | 1 refills | Status: DC

## 2023-03-08 NOTE — Progress Notes (Signed)
 LILLETTE Kristeen JINNY Gladis, CMA,acting as a neurosurgeon for Bruna Creighton, NP.,have documented all relevant documentation on the behalf of Bruna Creighton, NP,as directed by  Bruna Creighton, NP while in the presence of Bruna Creighton, NP.  Subjective:  Patient ID: Aaron Mccoy , male    DOB: Jan 22, 1976 , 48 y.o.   MRN: [id number]  No chief complaint on file.   HPI  Patient is a 48 year old male who presents today to establish care. Patient reports he has a history of A-fib and states that in the past 2 weeks he has been having chest pains intermittently, not caused by anything or relieved by anything. Patient states he has been having heart burn for a while and feels the need to burp always.Patient reports that he currently smokes black and mild he reports he is down to 2 a day.       Past Medical History:  Diagnosis Date   A-fib (HCC)    Hypertension    Irregular heartbeat      Family History  Problem Relation Age of Onset   Hearing loss Mother      Current Outpatient Medications:    diltiazem  (CARDIZEM  CD) 120 MG 24 hr capsule, Take 1 capsule (120 mg total) by mouth daily., Disp: 90 capsule, Rfl: 1   famotidine  (PEPCID ) 20 MG tablet, Take 1 tablet (20 mg total) by mouth 2 (two) times daily., Disp: 60 tablet, Rfl: 1   hydrochlorothiazide  (HYDRODIURIL ) 12.5 MG tablet, Take 1 tablet (12.5 mg total) by mouth daily., Disp: 90 tablet, Rfl: 1   methocarbamol  (ROBAXIN ) 500 MG tablet, Take 1 tablet (500 mg total) by mouth every 8 (eight) hours as needed for muscle spasms. (Patient not taking: Reported on 03/08/2023), Disp: 90 tablet, Rfl: 1   No Known Allergies   Review of Systems  Constitutional: Negative.   Respiratory: Negative.    Cardiovascular:  Positive for chest pain.  Gastrointestinal: Negative.   Endocrine: Negative.   Genitourinary: Negative.   Musculoskeletal:  Positive for arthralgias.       Elbow pain  Neurological: Negative.   Psychiatric/Behavioral: Negative.       Today's Vitals    03/08/23 1030  BP: 102/82  Pulse: 80  Temp: 99 F (37.2 C)  TempSrc: Oral  Weight: 226 lb 9.6 oz (102.8 kg)  Height: 5' 7 (1.702 m)  PainSc: 8   PainLoc: Elbow   Body mass index is 35.49 kg/m.  Wt Readings from Last 3 Encounters:  03/08/23 226 lb 9.6 oz (102.8 kg)  12/26/22 221 lb 12.8 oz (100.6 kg)  07/26/22 218 lb 6.4 oz (99.1 kg)    The 10-year ASCVD risk score (Arnett DK, et al., 2019) is: 10.4%   Values used to calculate the score:     Age: 66 years     Sex: Male     Is Non-Hispanic African American: Yes     Diabetic: No     Tobacco smoker: Yes     Systolic Blood Pressure: 102 mmHg     Is BP treated: Yes     HDL Cholesterol: 38 mg/dL     Total Cholesterol: 219 mg/dL  Objective:  Physical Exam HENT:     Head: Normocephalic.  Cardiovascular:     Rate and Rhythm: Normal rate and regular rhythm.  Pulmonary:     Effort: Pulmonary effort is normal.     Breath sounds: Normal breath sounds.  Musculoskeletal:        General: Tenderness present.  Skin:    General: Skin is warm and dry.  Neurological:     Mental Status: He is alert and oriented to person, place, and time.  Psychiatric:        Mood and Affect: Mood normal.        Behavior: Behavior normal.         Assessment And Plan:  Establishing care with new doctor, encounter for  Right elbow pain Assessment & Plan: Advise to use OTC pain   Gastroesophageal reflux disease, unspecified whether esophagitis present -     Famotidine ; Take 1 tablet (20 mg total) by mouth 2 (two) times daily.  Dispense: 60 tablet; Refill: 1  Essential hypertension Assessment & Plan: Low salt diet advised; continue current treatment regimen.  Orders: -     Microalbumin / creatinine urine ratio -     CMP14+EGFR -     CBC with Differential/Platelet  Paroxysmal atrial fibrillation [facility]) Assessment & Plan: EKG =NSR 03/08/2023, continue diltiazem  120 mg XR every day.   Chest pain at rest -     EKG 12-Lead -      Ambulatory referral to Cardiology  Hyperlipidemia due to dietary fat intake Assessment & Plan: Low fat diet advised.  Orders: -     Lipid panel  Severe obesity (BMI 35.0-35.9 with comorbidity) (HCC) Assessment & Plan: He is encouraged to strive for BMI less than 30 to decrease cardiac risk. Advised to aim for at least 150 minutes of exercise per week.    COVID-19 vaccination declined    Return in about 6 months (around 09/05/2023) for  physical.  Patient was given opportunity to ask questions. Patient verbalized understanding of the plan and was able to repeat key elements of the plan. All questions were answered to their satisfaction.    I, Bruna Creighton, NP, have reviewed all documentation for this visit. The documentation on 03/12/2023 for the exam, diagnosis, procedures, and orders are all accurate and complete.    IF YOU HAVE BEEN REFERRED TO A SPECIALIST, IT MAY TAKE 1-2 WEEKS TO SCHEDULE/PROCESS THE REFERRAL. IF YOU HAVE NOT HEARD FROM US /SPECIALIST IN TWO WEEKS, PLEASE GIVE US  A CALL AT [phone].

## 2023-03-09 LAB — CBC WITH DIFFERENTIAL/PLATELET
Basophils Absolute: 0 10*3/uL (ref 0.0–0.2)
Basos: 1 %
EOS (ABSOLUTE): 0.2 10*3/uL (ref 0.0–0.4)
Eos: 4 %
Hematocrit: 44.8 % (ref 37.5–51.0)
Hemoglobin: 14.3 g/dL (ref 13.0–17.7)
Immature Grans (Abs): 0 10*3/uL (ref 0.0–0.1)
Immature Granulocytes: 0 %
Lymphocytes Absolute: 2.2 10*3/uL (ref 0.7–3.1)
Lymphs: 43 %
MCH: 27.3 pg (ref 26.6–33.0)
MCHC: 31.9 g/dL (ref 31.5–35.7)
MCV: 86 fL (ref 79–97)
Monocytes Absolute: 0.4 10*3/uL (ref 0.1–0.9)
Monocytes: 8 %
Neutrophils Absolute: 2.3 10*3/uL (ref 1.4–7.0)
Neutrophils: 44 %
Platelets: 275 10*3/uL (ref 150–450)
RBC: 5.23 x10E6/uL (ref 4.14–5.80)
RDW: 14.3 % (ref 11.6–15.4)
WBC: 5.1 10*3/uL (ref 3.4–10.8)

## 2023-03-09 LAB — CMP14+EGFR
ALT: 27 [IU]/L (ref 0–44)
AST: 28 [IU]/L (ref 0–40)
Albumin: 4.7 g/dL (ref 4.1–5.1)
Alkaline Phosphatase: 76 [IU]/L (ref 44–121)
BUN/Creatinine Ratio: 9 (ref 9–20)
BUN: 12 mg/dL (ref 6–24)
Bilirubin Total: 0.4 mg/dL (ref 0.0–1.2)
CO2: 24 mmol/L (ref 20–29)
Calcium: 10 mg/dL (ref 8.7–10.2)
Chloride: 102 mmol/L (ref 96–106)
Creatinine, Ser: 1.31 mg/dL — ABNORMAL HIGH (ref 0.76–1.27)
Globulin, Total: 2.7 g/dL (ref 1.5–4.5)
Glucose: 101 mg/dL — ABNORMAL HIGH (ref 70–99)
Potassium: 4.2 mmol/L (ref 3.5–5.2)
Sodium: 141 mmol/L (ref 134–144)
Total Protein: 7.4 g/dL (ref 6.0–8.5)
eGFR: 67 mL/min/{1.73_m2} (ref >59)

## 2023-03-09 LAB — LIPID PANEL
Chol/HDL Ratio: 5.8 {ratio} — ABNORMAL HIGH (ref 0.0–5.0)
Cholesterol, Total: 219 mg/dL — ABNORMAL HIGH (ref 100–199)
HDL: 38 mg/dL — ABNORMAL LOW (ref >39)
LDL Chol Calc (NIH): 151 mg/dL — ABNORMAL HIGH (ref 0–99)
Triglycerides: 166 mg/dL — ABNORMAL HIGH (ref 0–149)
VLDL Cholesterol Cal: 30 mg/dL (ref 5–40)

## 2023-03-09 LAB — MICROALBUMIN / CREATININE URINE RATIO
Creatinine, Urine: 68.6 mg/dL
Microalb/Creat Ratio: <4 mg/g{creat} (ref 0–29)
Microalbumin, Urine: <3 ug/mL

## 2023-03-12 DIAGNOSIS — I480 Paroxysmal atrial fibrillation: Secondary | ICD-10-CM | POA: Insufficient documentation

## 2023-03-12 DIAGNOSIS — R079 Chest pain, unspecified: Secondary | ICD-10-CM | POA: Insufficient documentation

## 2023-03-12 DIAGNOSIS — Z2821 Immunization not carried out because of patient refusal: Secondary | ICD-10-CM | POA: Insufficient documentation

## 2023-03-12 DIAGNOSIS — E7849 Other hyperlipidemia: Secondary | ICD-10-CM | POA: Insufficient documentation

## 2023-03-12 DIAGNOSIS — M25521 Pain in right elbow: Secondary | ICD-10-CM | POA: Insufficient documentation

## 2023-03-12 DIAGNOSIS — Z7689 Persons encountering health services in other specified circumstances: Secondary | ICD-10-CM | POA: Insufficient documentation

## 2023-03-12 MED ORDER — ATORVASTATIN CALCIUM 20 MG PO TABS: 20.0000 mg | ORAL_TABLET | Freq: Every day | ORAL | 11 refills | Status: DC

## 2023-03-12 NOTE — Assessment & Plan Note (Signed)
 He is encouraged to strive for BMI less than 30 to decrease cardiac risk. Advised to aim for at least 150 minutes of exercise per week.

## 2023-03-12 NOTE — Assessment & Plan Note (Signed)
 Advise to use OTC pain

## 2023-03-12 NOTE — Assessment & Plan Note (Signed)
 Low fat diet advised.

## 2023-03-12 NOTE — Assessment & Plan Note (Addendum)
 EKG =NSR 03/08/2023, continue diltiazem 120 mg XR every day.

## 2023-03-12 NOTE — Assessment & Plan Note (Signed)
 Low salt diet advised; continue current treatment regimen.

## 2023-03-12 NOTE — Progress Notes (Signed)
 Total cholesterol, LDL is elevated. Lipitor 20 mg once every day sent to the pharmacy. Creatinine levels slightly elevated, make sure to take BP meds and keep BP under control. All other labs are normal

## 2023-03-27 ENCOUNTER — Telehealth (INDEPENDENT_AMBULATORY_CARE_PROVIDER_SITE_OTHER): Payer: Self-pay | Admitting: Primary Care

## 2023-03-27 NOTE — Telephone Encounter (Signed)
Called pt because I see that pt has an upcoming atp with another rprovider. Pt has atp with [place] schedule for tom 1/22. Will speak with pt about not being able to have ywo PCP's

## 2023-03-28 ENCOUNTER — Ambulatory Visit (INDEPENDENT_AMBULATORY_CARE_PROVIDER_SITE_OTHER): Payer: Self-pay | Admitting: Primary Care

## 2023-03-31 ENCOUNTER — Other Ambulatory Visit: Payer: Self-pay | Admitting: Family Medicine

## 2023-03-31 DIAGNOSIS — K219 Gastro-esophageal reflux disease without esophagitis: Secondary | ICD-10-CM

## 2023-04-01 ENCOUNTER — Encounter (HOSPITAL_BASED_OUTPATIENT_CLINIC_OR_DEPARTMENT_OTHER): Payer: Self-pay | Admitting: Emergency Medicine

## 2023-04-01 ENCOUNTER — Emergency Department (HOSPITAL_BASED_OUTPATIENT_CLINIC_OR_DEPARTMENT_OTHER): Payer: Managed Care, Other (non HMO)

## 2023-04-01 ENCOUNTER — Other Ambulatory Visit: Payer: Self-pay

## 2023-04-01 ENCOUNTER — Emergency Department (HOSPITAL_BASED_OUTPATIENT_CLINIC_OR_DEPARTMENT_OTHER)
Admission: EM | Admit: 2023-04-01 | Discharge: 2023-04-01 | Disposition: A | Discharge status: Home / Self Care | Payer: Managed Care, Other (non HMO) | Attending: Emergency Medicine | Admitting: Emergency Medicine

## 2023-04-01 DIAGNOSIS — Z79899 Other long term (current) drug therapy: Secondary | ICD-10-CM | POA: Diagnosis not present

## 2023-04-01 DIAGNOSIS — M25512 Pain in left shoulder: Secondary | ICD-10-CM | POA: Diagnosis present

## 2023-04-01 DIAGNOSIS — I10 Essential (primary) hypertension: Secondary | ICD-10-CM | POA: Insufficient documentation

## 2023-04-01 LAB — CBC WITH DIFFERENTIAL/PLATELET
Abs Immature Granulocytes: 0.01 10*3/uL (ref 0.00–0.07)
Basophils Absolute: 0 10*3/uL (ref 0.0–0.1)
Basophils Relative: 1 %
Eosinophils Absolute: 0.1 10*3/uL (ref 0.0–0.5)
Eosinophils Relative: 2 %
HCT: 43.2 % (ref 39.0–52.0)
Hemoglobin: 14 g/dL (ref 13.0–17.0)
Immature Granulocytes: 0 %
Lymphocytes Relative: 35 %
Lymphs Abs: 2.1 10*3/uL (ref 0.7–4.0)
MCH: 27.8 pg (ref 26.0–34.0)
MCHC: 32.4 g/dL (ref 30.0–36.0)
MCV: 85.9 fL (ref 80.0–100.0)
Monocytes Absolute: 0.4 10*3/uL (ref 0.1–1.0)
Monocytes Relative: 7 %
Neutro Abs: 3.3 10*3/uL (ref 1.7–7.7)
Neutrophils Relative %: 55 %
Platelets: 238 10*3/uL (ref 150–400)
RBC: 5.03 MIL/uL (ref 4.22–5.81)
RDW: 16.5 % — ABNORMAL HIGH (ref 11.5–15.5)
WBC: 5.9 10*3/uL (ref 4.0–10.5)
nRBC: 0 % (ref 0.0–0.2)

## 2023-04-01 LAB — BASIC METABOLIC PANEL
Anion gap: 10 (ref 5–15)
BUN: 13 mg/dL (ref 6–20)
CO2: 24 mmol/L (ref 22–32)
Calcium: 9.4 mg/dL (ref 8.9–10.3)
Chloride: 105 mmol/L (ref 98–111)
Creatinine, Ser: 1.15 mg/dL (ref 0.61–1.24)
GFR, Estimated: >60 mL/min (ref >60)
Glucose, Bld: 101 mg/dL — ABNORMAL HIGH (ref 70–99)
Potassium: 3.8 mmol/L (ref 3.5–5.1)
Sodium: 139 mmol/L (ref 135–145)

## 2023-04-01 LAB — TROPONIN I (HIGH SENSITIVITY): Troponin I (High Sensitivity): 6 ng/L (ref <18)

## 2023-04-01 MED ORDER — CYCLOBENZAPRINE HCL 10 MG PO TABS: 10.0000 mg | ORAL_TABLET | Freq: Two times a day (BID) | ORAL | 0 refills | Status: DC | PRN

## 2023-04-01 MED ORDER — ASPIRIN 81 MG PO CHEW
324.0000 mg | CHEWABLE_TABLET | Freq: Once | ORAL | Status: AC
  Administered 2023-04-01: 324 mg via ORAL
  Filled 2023-04-01: qty 4

## 2023-04-01 MED ORDER — CYCLOBENZAPRINE HCL 10 MG PO TABS
10.0000 mg | ORAL_TABLET | Freq: Once | ORAL | Status: AC
  Administered 2023-04-01: 10 mg via ORAL
  Filled 2023-04-01: qty 1

## 2023-04-01 MED ORDER — ASPIRIN 81 MG PO CHEW
CHEWABLE_TABLET | ORAL | Status: AC
  Filled 2023-04-01: qty 1

## 2023-04-01 NOTE — Discharge Instructions (Addendum)
You have been seen today for your complaint of left shoulder pain. Your lab work was reassuring. Your imaging was reassuring. Your discharge medications include Flexeril. This is a muscle relaxer. It may cause drowsiness. Do not drive, operate heavy machinery or make important decisions when taking this medication. Only take it at night until you know how it affects you. Only take it as needed and take other medications such as ibuprofen or tylenol prior to trying this medication.  Alternate tylenol and ibuprofen for pain. You may alternate these every 4 hours. You may take up to 800 mg of ibuprofen at a time and up to 1000 mg of tylenol. Follow up with: Dr. Eulah Pont.  He is an Investment banker, operational.  Call to schedule follow-up appointment. Please seek immediate medical care if you develop any of the following symptoms: Your arm, hand, or fingers turn white or blue. At this time there does not appear to be the presence of an emergent medical condition, however there is always the potential for conditions to change. Please read and follow the below instructions.  Do not take your medicine if  develop an itchy rash, swelling in your mouth or lips, or difficulty breathing; call 911 and seek immediate emergency medical attention if this occurs.  You may review your lab tests and imaging results in their entirety on your MyChart account.  Please discuss all results of fully with your primary care provider and other specialist at your follow-up visit.  Note: Portions of this text may have been transcribed using voice recognition software. Every effort was made to ensure accuracy; however, inadvertent computerized transcription errors may still be present.

## 2023-04-01 NOTE — ED Notes (Signed)
Patient transported to X-ray

## 2023-04-01 NOTE — ED Provider Notes (Signed)
[place] [facility] Provider Note   CSN: [id number] Arrival date & time: 04/01/23  1048     History  Chief Complaint  Patient presents with   Arm Pain    left    Aaron Mccoy is a 48 y.o. male.  With a history of A-fib, hypertension presenting to the ED for evaluation of left shoulder pain.  Pain began when he woke up this morning.  He denies any known trauma.  Pain is localized to the medial and posterior deltoid and radiates somewhat into the left tricep area.  He denies any chest pain.  Pain does not radiate down the arm.  No numbness, weakness or tingling.  Pain is worse with any range of motion and palpation.  No shortness of breath.   Arm Pain       Home Medications Prior to Admission medications   Medication Sig Start Date End Date Taking? Authorizing Provider  cyclobenzaprine (FLEXERIL) 10 MG tablet Take 1 tablet (10 mg total) by mouth 2 (two) times daily as needed. 04/01/23  Yes Dalani Mette, Edsel Petrin, PA-C  atorvastatin (LIPITOR) 20 MG tablet Take 1 tablet (20 mg total) by mouth daily. 03/12/23 03/11/24  Ellender Hose, NP  diltiazem (CARDIZEM CD) 120 MG 24 hr capsule Take 1 capsule (120 mg total) by mouth daily. 12/26/22   Grayce Sessions, NP  famotidine (PEPCID) 20 MG tablet Take 1 tablet (20 mg total) by mouth 2 (two) times daily. 03/08/23 03/07/24  Ellender Hose, NP  hydrochlorothiazide (HYDRODIURIL) 12.5 MG tablet Take 1 tablet (12.5 mg total) by mouth daily. 12/26/22   Grayce Sessions, NP  methocarbamol (ROBAXIN) 500 MG tablet Take 1 tablet (500 mg total) by mouth every 8 (eight) hours as needed for muscle spasms. Patient not taking: Reported on 03/08/2023 12/26/22   Grayce Sessions, NP      Allergies    Patient has no known allergies.    Review of Systems   Review of Systems  Musculoskeletal:  Positive for arthralgias.  All other systems reviewed and are negative.   Physical Exam Updated Vital Signs BP (!) 131/97  (BP Location: Right Arm)   Pulse 92   Temp 98 F (36.7 C) (Oral)   Resp 16   Wt 102.5 kg   SpO2 98%   BMI 35.40 kg/m  Physical Exam Vitals and nursing note reviewed.  Constitutional:      General: He is not in acute distress.    Appearance: Normal appearance. He is normal weight. He is not ill-appearing.  HENT:     Head: Normocephalic and atraumatic.  Cardiovascular:     Rate and Rhythm: Normal rate.  Pulmonary:     Effort: Pulmonary effort is normal. No respiratory distress.  Abdominal:     General: Abdomen is flat.  Musculoskeletal:     Cervical back: Neck supple.     Comments: TTP to left medial and posterior deltoid.  Significantly decreased AROM.  Decreased passive range of motion as well in all directions including forward flexion, abduction, internal and external rotation.  Grip strength out of 5 bilaterally.  Sensation intact in all digits.  Capillary refill normal.  Compartments are soft.  Skin:    General: Skin is warm and dry.  Neurological:     Mental Status: He is alert and oriented to person, place, and time.  Psychiatric:        Mood and Affect: Mood normal.  Behavior: Behavior normal.     ED Results / Procedures / Treatments   Labs (all labs ordered are listed, but only abnormal results are displayed) Labs Reviewed  BASIC METABOLIC PANEL - Abnormal; Notable for the following components:      Result Value   Glucose, Bld 101 (*)    All other components within normal limits  CBC WITH DIFFERENTIAL/PLATELET - Abnormal; Notable for the following components:   RDW 16.5 (*)    All other components within normal limits  TROPONIN I (HIGH SENSITIVITY)    EKG EKG Interpretation Date/Time:  Monday April 01 2023 11:53:22 EST Ventricular Rate:  82 PR Interval:  193 QRS Duration:  94 QT Interval:  351 QTC Calculation: 410 R Axis:   -3  Text Interpretation: Sinus rhythm Low voltage, precordial leads Confirmed by Alona Bene [phone]) on 04/01/2023  11:55:18 AM  Radiology DG Shoulder Left Result Date: 04/01/2023 CLINICAL DATA:  left shoulder pain x 1 day, limited ROM in all directions EXAM: LEFT SHOULDER - 2+ VIEW COMPARISON:  None Available. FINDINGS: There is no evidence of fracture or dislocation. There is no evidence of arthropathy or other focal bone abnormality. Soft tissues are unremarkable. IMPRESSION: Negative. Electronically Signed   By: Tish Frederickson M.D.   On: 04/01/2023 12:23   DG Humerus Left Result Date: 04/01/2023 CLINICAL DATA:  Left upper extremity pain. EXAM: LEFT HUMERUS - 2+ VIEW COMPARISON:  None Available. FINDINGS: There is no evidence of fracture or other focal bone lesions. Soft tissues are unremarkable. IMPRESSION: Negative. Electronically Signed   By: Elgie Collard M.D.   On: 04/01/2023 11:23    Procedures Procedures    Medications Ordered in ED Medications  aspirin 81 MG chewable tablet (  Not Given 04/01/23 1209)  aspirin chewable tablet 324 mg (324 mg Oral Given 04/01/23 1207)  cyclobenzaprine (FLEXERIL) tablet 10 mg (10 mg Oral Given 04/01/23 1208)    ED Course/ Medical Decision Making/ A&P                                 Medical Decision Making Amount and/or Complexity of Data Reviewed Labs: ordered. Radiology: ordered.  Risk OTC drugs. Prescription drug management.  This patient presents to the ED for concern of left shoulder pain, this involves an extensive number of treatment options, and is a complaint that carries with it a high risk of complications and morbidity.  The differential diagnosis includes fracture, strain, sprain, contusion, dislocation, cardiac etiology  My initial workup includes labs, imaging, symptom control  Additional history obtained from: Nursing notes from this visit.  I ordered, reviewed and interpreted labs which include: BMP, CBC, troponin.  No leukocytosis or anemia.  No electrolyte derangement or kidney dysfunction.  Troponin negative at 6.  I ordered  imaging studies including x-ray left shoulder left humerus, left shoulder I independently visualized and interpreted imaging which showed no acute osseous abnormalities I agree with the radiologist interpretation  Afebrile, hemodynamically stable.  48 year old male presenting to the ED for evaluation of atraumatic left shoulder pain.  This is reproducible with palpation and movement.  No chest pain.  Given patient demographics, ACS workup was initiated.  EKG without acute ischemic changes.  Troponin negative.  Lower suspicion for ACS.  Suspect musculoskeletal pain.  Neurovascular status intact.  He was given a sling and prescription for muscle relaxant and educated on potential side effects.  He was given contact information for  orthopedics and encouraged to follow-up.  He was given return precautions.  Stable at discharge.  At this time there does not appear to be any evidence of an acute emergency medical condition and the patient appears stable for discharge with appropriate outpatient follow up. Diagnosis was discussed with patient who verbalizes understanding of care plan and is agreeable to discharge. I have discussed return precautions with patient who verbalizes understanding. Patient encouraged to follow-up with their PCP within 1 week. All questions answered.  Note: Portions of this report may have been transcribed using voice recognition software. Every effort was made to ensure accuracy; however, inadvertent computerized transcription errors may still be present.         Final Clinical Impression(s) / ED Diagnoses Final diagnoses:  Acute pain of left shoulder    Rx / DC Orders ED Discharge Orders          Ordered    cyclobenzaprine (FLEXERIL) 10 MG tablet  2 times daily PRN        04/01/23 1318              Michelle Piper, Cordelia Poche 04/01/23 1321    Long, Arlyss Repress, MD 04/05/23 1546

## 2023-04-01 NOTE — ED Triage Notes (Signed)
Non traumatic left upper arm , patch on yet no relief . Lmited ROM . Painful ROM

## 2023-06-09 ENCOUNTER — Other Ambulatory Visit (INDEPENDENT_AMBULATORY_CARE_PROVIDER_SITE_OTHER): Payer: Self-pay | Admitting: Primary Care

## 2023-06-09 DIAGNOSIS — I10 Essential (primary) hypertension: Secondary | ICD-10-CM

## 2023-06-11 NOTE — Telephone Encounter (Signed)
Pt. No longer seen at practice.

## 2023-06-26 ENCOUNTER — Other Ambulatory Visit (INDEPENDENT_AMBULATORY_CARE_PROVIDER_SITE_OTHER): Payer: Self-pay | Admitting: Primary Care

## 2023-06-26 DIAGNOSIS — I10 Essential (primary) hypertension: Secondary | ICD-10-CM

## 2023-07-09 ENCOUNTER — Other Ambulatory Visit: Payer: Self-pay | Admitting: Family Medicine

## 2023-07-09 DIAGNOSIS — I10 Essential (primary) hypertension: Secondary | ICD-10-CM

## 2023-07-09 MED ORDER — HYDROCHLOROTHIAZIDE 12.5 MG PO TABS
12.5000 mg | ORAL_TABLET | Freq: Every day | ORAL | 1 refills | Status: DC
Start: 2023-07-09 — End: 2023-11-12

## 2023-07-09 NOTE — Telephone Encounter (Signed)
 Copied from CRM [id number]. Topic: Clinical - Medication Refill >> Jul 09, 2023 10:52 AM Donald Frost wrote: Most Recent Primary Care Visit:  Provider: Jarrett Merry, PAT  Department: [facility]  Visit Type: NEW PATIENT  Date: 03/08/2023  Medication: hydrochlorothiazide  (HYDRODIURIL ) 12.5 MG tablet  Has the patient contacted their pharmacy? No   Is this the correct pharmacy for this prescription? Yes If no, delete pharmacy and type the correct one.  This is the patient's preferred pharmacy:   [facility] [phone] [facility], [place] - [address] [place] [address] Phone: [phone] Fax: [phone]   Has the prescription been filled recently? No  Is the patient out of the medication? Yes he took his last one this morning  Has the patient been seen for an appointment in the last year OR does the patient have an upcoming appointment? Yes  Can we respond through MyChart? No  Please assist patient further

## 2023-09-05 ENCOUNTER — Ambulatory Visit: Payer: Managed Care, Other (non HMO) | Admitting: Family Medicine

## 2023-09-05 VITALS — BP 122/80 | HR 82 | Temp 98.6°F | Ht 67.0 in | Wt 224.0 lb

## 2023-09-05 DIAGNOSIS — R079 Chest pain, unspecified: Secondary | ICD-10-CM

## 2023-09-05 DIAGNOSIS — R7309 Other abnormal glucose: Secondary | ICD-10-CM

## 2023-09-05 DIAGNOSIS — R1032 Left lower quadrant pain: Secondary | ICD-10-CM

## 2023-09-05 DIAGNOSIS — I10 Essential (primary) hypertension: Secondary | ICD-10-CM

## 2023-09-05 DIAGNOSIS — E7849 Other hyperlipidemia: Secondary | ICD-10-CM | POA: Diagnosis not present

## 2023-09-05 DIAGNOSIS — Z23 Encounter for immunization: Secondary | ICD-10-CM

## 2023-09-05 DIAGNOSIS — Z1211 Encounter for screening for malignant neoplasm of colon: Secondary | ICD-10-CM

## 2023-09-05 DIAGNOSIS — I480 Paroxysmal atrial fibrillation: Secondary | ICD-10-CM

## 2023-09-05 DIAGNOSIS — Z0000 Encounter for general adult medical examination without abnormal findings: Secondary | ICD-10-CM

## 2023-09-05 DIAGNOSIS — Z6835 Body mass index (BMI) 35.0-35.9, adult: Secondary | ICD-10-CM

## 2023-09-05 DIAGNOSIS — E66812 Obesity, class 2: Secondary | ICD-10-CM

## 2023-09-05 DIAGNOSIS — R351 Nocturia: Secondary | ICD-10-CM

## 2023-09-05 NOTE — Assessment & Plan Note (Signed)
 Lipid Panel     Component Value Date/Time   CHOL 219 (H) 03/08/2023 1136   TRIG 166 (H) 03/08/2023 1136   HDL 38 (L) 03/08/2023 1136   CHOLHDL 5.8 (H) 03/08/2023 1136   LDLCALC 151 (H) 03/08/2023 1136   LABVLDL 30 03/08/2023 1136

## 2023-09-05 NOTE — Patient Instructions (Signed)
 Health Maintenance, Male  Adopting a healthy lifestyle and getting preventive care are important in promoting health and wellness. Ask your health care provider about:  The right schedule for you to have regular tests and exams.  Things you can do on your own to prevent diseases and keep yourself healthy.  What should I know about diet, weight, and exercise?  Eat a healthy diet    Eat a diet that includes plenty of vegetables, fruits, low-fat dairy products, and lean protein.  Do not eat a lot of foods that are high in solid fats, added sugars, or sodium.  Maintain a healthy weight  Body mass index (BMI) is a measurement that can be used to identify possible weight problems. It estimates body fat based on height and weight. Your health care provider can help determine your BMI and help you achieve or maintain a healthy weight.  Get regular exercise  Get regular exercise. This is one of the most important things you can do for your health. Most adults should:  Exercise for at least 150 minutes each week. The exercise should increase your heart rate and make you sweat (moderate-intensity exercise).  Do strengthening exercises at least twice a week. This is in addition to the moderate-intensity exercise.  Spend less time sitting. Even light physical activity can be beneficial.  Watch cholesterol and blood lipids  Have your blood tested for lipids and cholesterol at 50 years of age, then have this test every 5 years.  You may need to have your cholesterol levels checked more often if:  Your lipid or cholesterol levels are high.  You are older than 50 years of age.  You are at high risk for heart disease.  What should I know about cancer screening?  Many types of cancers can be detected early and may often be prevented. Depending on your health history and family history, you may need to have cancer screening at various ages. This may include screening for:  Colorectal cancer.  Prostate cancer.  Skin cancer.  Lung  cancer.  What should I know about heart disease, diabetes, and high blood pressure?  Blood pressure and heart disease  High blood pressure causes heart disease and increases the risk of stroke. This is more likely to develop in people who have high blood pressure readings or are overweight.  Talk with your health care provider about your target blood pressure readings.  Have your blood pressure checked:  Every 3-5 years if you are 50-95 years of age.  Every year if you are 50 years old or older.  If you are between the ages of 29 and 29 and are a current or former smoker, ask your health care provider if you should have a one-time screening for abdominal aortic aneurysm (AAA).  Diabetes  Have regular diabetes screenings. This checks your fasting blood sugar level. Have the screening done:  Once every three years after age 50 if you are at a normal weight and have a low risk for diabetes.  More often and at a younger age if you are overweight or have a high risk for diabetes.  What should I know about preventing infection?  Hepatitis B  If you have a higher risk for hepatitis B, you should be screened for this virus. Talk with your health care provider to find out if you are at risk for hepatitis B infection.  Hepatitis C  Blood testing is recommended for:  Everyone born from [birth year] through 1967.  Anyone  with known risk factors for hepatitis C.  Sexually transmitted infections (STIs)  You should be screened each year for STIs, including gonorrhea and chlamydia, if:  You are sexually active and are younger than 50 years of age.  You are older than 50 years of age and your health care provider tells you that you are at risk for this type of infection.  Your sexual activity has changed since you were last screened, and you are at increased risk for chlamydia or gonorrhea. Ask your health care provider if you are at risk.  Ask your health care provider about whether you are at high risk for HIV. Your health care provider  may recommend a prescription medicine to help prevent HIV infection. If you choose to take medicine to prevent HIV, you should first get tested for HIV. You should then be tested every 3 months for as long as you are taking the medicine.  Follow these instructions at home:  Alcohol use  Do not drink alcohol if your health care provider tells you not to drink.  If you drink alcohol:  Limit how much you have to 0-2 drinks a day.  Know how much alcohol is in your drink. In the [place] one drink equals one 12 oz bottle of beer (355 mL), one 5 oz glass of wine (148 mL), or one 1 oz glass of hard liquor (44 mL).  Lifestyle  Do not use any products that contain nicotine or tobacco. These products include cigarettes, chewing tobacco, and vaping devices, such as e-cigarettes. If you need help quitting, ask your health care provider.  Do not use street drugs.  Do not share needles.  Ask your health care provider for help if you need support or information about quitting drugs.  General instructions  Schedule regular health, dental, and eye exams.  Stay current with your vaccines.  Tell your health care provider if:  You often feel depressed.  You have ever been abused or do not feel safe at home.  Summary  Adopting a healthy lifestyle and getting preventive care are important in promoting health and wellness.  Follow your health care provider's instructions about healthy diet, exercising, and getting tested or screened for diseases.  Follow your health care provider's instructions on monitoring your cholesterol and blood pressure.  This information is not intended to replace advice given to you by your health care provider. Make sure you discuss any questions you have with your health care provider.  Document Revised: 07/11/2020 Document Reviewed: 07/11/2020  Elsevier Patient Education  2024 [employer].

## 2023-09-05 NOTE — Progress Notes (Signed)
 I,Jameka J Llittleton, CMA,acting as a Neurosurgeon for [employer], NP.,have documented all relevant documentation on the behalf of Bruna Creighton, NP,as directed by  Bruna Creighton, NP while in the presence of Bruna Creighton, NP.  Subjective:   Patient ID: Aaron Mccoy , male    DOB: 10-08-1975 , 48 y.o.   MRN: [id number]  Chief Complaint  Patient presents with   Annual Exam    HPI  Patient is a 48 year old male presents who  today for his annual  physical examination. Patient reports compliance with his meds. Patient states he gets occasional chest pain but admits he did not go for his cardiology appointment in March,2025. He has a diagnosis of paroxysmal atrial fibrillation, and is on Cardizem  120 mg every day. Patient also reports pain in his left lower abdomen , wants to rule out the possibility of a hernia because he has had a hernia taken out of his right side in the past.       Past Medical History:  Diagnosis Date   A-fib (HCC)    Hypertension    Irregular heartbeat      Family History  Problem Relation Age of Onset   Hearing loss Mother      Current Outpatient Medications:    cyclobenzaprine  (FLEXERIL ) 10 MG tablet, Take 1 tablet (10 mg total) by mouth 2 (two) times daily as needed., Disp: 10 tablet, Rfl: 0   diltiazem  (CARDIZEM  CD) 120 MG 24 hr capsule, Take 1 capsule (120 mg total) by mouth daily., Disp: 90 capsule, Rfl: 1   famotidine  (PEPCID ) 20 MG tablet, TAKE 1 TABLET BY MOUTH TWICE A DAY, Disp: 180 tablet, Rfl: 1   hydrochlorothiazide  (HYDRODIURIL ) 12.5 MG tablet, Take 1 tablet (12.5 mg total) by mouth daily., Disp: 90 tablet, Rfl: 1   atorvastatin  (LIPITOR) 20 MG tablet, Take 1 tablet (20 mg total) by mouth daily., Disp: 90 tablet, Rfl: 3   methocarbamol  (ROBAXIN ) 500 MG tablet, Take 1 tablet (500 mg total) by mouth every 8 (eight) hours as needed for muscle spasms. (Patient not taking: Reported on 09/05/2023), Disp: 90 tablet, Rfl: 1   No Known Allergies    Flowsheet  Row Office Visit from 09/05/2023 in [facility]  PHQ-2 Total Score 0   Social History   Substance and Sexual Activity  Alcohol Use Yes   Social History   Tobacco Use  Smoking Status Former   Current packs/day: 1.00   Types: Cigarettes, Cigars  Smokeless Tobacco Never  .   Review of Systems  Constitutional: Negative.   HENT: Negative.    Eyes: Negative.   Respiratory: Negative.    Cardiovascular:  Positive for chest pain.  Gastrointestinal:  Positive for abdominal pain.  Endocrine: Negative.   Genitourinary: Negative.   Musculoskeletal: Negative.   Skin: Negative.   Neurological: Negative.   Hematological: Negative.   Psychiatric/Behavioral: Negative.       Today's Vitals   09/05/23 0841  BP: 122/80  Pulse: 82  Temp: 98.6 F (37 C)  TempSrc: Oral  Weight: 224 lb (101.6 kg)  Height: 5' 7 (1.702 m)  PainSc: 0-No pain   Body mass index is 35.08 kg/m.  Wt Readings from Last 3 Encounters:  09/05/23 224 lb (101.6 kg)  04/01/23 226 lb (102.5 kg)  03/08/23 226 lb 9.6 oz (102.8 kg)    Objective:  Physical Exam HENT:     Head: Normocephalic.  Cardiovascular:     Rate and Rhythm:  Normal rate and regular rhythm.  Pulmonary:     Effort: Pulmonary effort is normal.     Breath sounds: Normal breath sounds.  Abdominal:     General: Bowel sounds are normal.  Musculoskeletal:        General: Normal range of motion.  Skin:    General: Skin is warm and dry.  Neurological:     General: No focal deficit present.     Mental Status: He is alert and oriented to person, place, and time.  Psychiatric:        Mood and Affect: Mood normal.        Behavior: Behavior normal.         Assessment And Plan:    Encounter for general adult medical examination w/o abnormal findings  Essential hypertension Assessment & Plan: Chronic. Stable, continue current treatment  Orders: -     CBC -     CMP14+EGFR  Paroxysmal atrial fibrillation  (HCC) Assessment & Plan: Continue Cardizem  120 mg every day.   Hyperlipidemia due to dietary fat intake Assessment & Plan: Lipid Panel     Component Value Date/Time   CHOL 219 (H) 03/08/2023 1136   TRIG 166 (H) 03/08/2023 1136   HDL 38 (L) 03/08/2023 1136   CHOLHDL 5.8 (H) 03/08/2023 1136   LDLCALC 151 (H) 03/08/2023 1136   LABVLDL 30 03/08/2023 1136    Orders: -     Lipid panel -     Atorvastatin  Calcium ; Take 1 tablet (20 mg total) by mouth daily.  Dispense: 90 tablet; Refill: 3  Need for pneumococcal 20-valent conjugate vaccination -     Pneumococcal conjugate vaccine 20-valent  Nocturia -     PSA  Abnormal glucose -     Hemoglobin A1c  Screening for colon cancer -     Ambulatory referral to Gastroenterology  Abdominal pain, LLQ -     CT ABDOMEN PELVIS WO CONTRAST; Future  Class 2 severe obesity due to excess calories with serious comorbidity and body mass index (BMI) of 35.0 to 35.9 in adult [facility]) Assessment & Plan: He is encouraged to strive for BMI less than 30 to decrease cardiac risk. Advised to aim for at least 150 minutes of exercise per week.    Chest pain at rest -     Ambulatory referral to Cardiology     Return for 1 year physical, 4 months BP. Patient was given opportunity to ask questions. Patient verbalized understanding of the plan and was able to repeat key elements of the plan. All questions were answered to their satisfaction.   I, Bruna Creighton, NP, have reviewed all documentation for this visit. The documentation on 09/12/2023 for the exam, diagnosis, procedures, and orders are all accurate and complete.

## 2023-09-05 NOTE — Assessment & Plan Note (Signed)
 He is encouraged to strive for BMI less than 30 to decrease cardiac risk. Advised to aim for at least 150 minutes of exercise per week.

## 2023-09-06 LAB — CBC
Hematocrit: 45.3 % (ref 37.5–51.0)
Hemoglobin: 14.5 g/dL (ref 13.0–17.7)
MCH: 28 pg (ref 26.6–33.0)
MCHC: 32 g/dL (ref 31.5–35.7)
MCV: 88 fL (ref 79–97)
Platelets: 267 x10E3/uL (ref 150–450)
RBC: 5.18 x10E6/uL (ref 4.14–5.80)
RDW: 14 % (ref 11.6–15.4)
WBC: 5.1 x10E3/uL (ref 3.4–10.8)

## 2023-09-06 LAB — CMP14+EGFR
ALT: 27 IU/L (ref 0–44)
AST: 27 IU/L (ref 0–40)
Albumin: 4.7 g/dL (ref 4.1–5.1)
Alkaline Phosphatase: 73 IU/L (ref 44–121)
BUN/Creatinine Ratio: 14 (ref 9–20)
BUN: 15 mg/dL (ref 6–24)
Bilirubin Total: 0.6 mg/dL (ref 0.0–1.2)
CO2: 22 mmol/L (ref 20–29)
Calcium: 10.4 mg/dL — ABNORMAL HIGH (ref 8.7–10.2)
Chloride: 103 mmol/L (ref 96–106)
Creatinine, Ser: 1.1 mg/dL (ref 0.76–1.27)
Globulin, Total: 2.6 g/dL (ref 1.5–4.5)
Glucose: 90 mg/dL (ref 70–99)
Potassium: 4.6 mmol/L (ref 3.5–5.2)
Sodium: 140 mmol/L (ref 134–144)
Total Protein: 7.3 g/dL (ref 6.0–8.5)
eGFR: 82 mL/min/1.73 (ref >59)

## 2023-09-06 LAB — LIPID PANEL
Chol/HDL Ratio: 3.1 ratio (ref 0.0–5.0)
Cholesterol, Total: 115 mg/dL (ref 100–199)
HDL: 37 mg/dL — ABNORMAL LOW (ref >39)
LDL Chol Calc (NIH): 66 mg/dL (ref 0–99)
Triglycerides: 54 mg/dL (ref 0–149)
VLDL Cholesterol Cal: 12 mg/dL (ref 5–40)

## 2023-09-06 LAB — HEMOGLOBIN A1C
Est. average glucose Bld gHb Est-mCnc: 128 mg/dL
Hgb A1c MFr Bld: 6.1 % — ABNORMAL HIGH (ref 4.8–5.6)

## 2023-09-06 LAB — PSA: Prostate Specific Ag, Serum: 0.8 ng/mL (ref 0.0–4.0)

## 2023-09-12 ENCOUNTER — Ambulatory Visit: Payer: Self-pay | Admitting: Family Medicine

## 2023-09-12 MED ORDER — ATORVASTATIN CALCIUM 20 MG PO TABS: 20.0000 mg | ORAL_TABLET | Freq: Every day | ORAL | 3 refills | Status: DC

## 2023-09-12 NOTE — Progress Notes (Signed)
 Your PSA is normal, no indication of prostrate cancer.  Your kidney function has improved this is good, your A1c is still 6.1 this is good that there is no increase but you are still prediabetic low-carb and exercise is still advised.  Your cholesterol levels have decreased tremendously this is very good.  Your HDL levels are low, your HDL is your good cholesterol and you need to boost this up by eating foods rich in good fats e.g. nuts, eating fish rich in omega-3 oils for example fish like salmon , tuna etc. and continue taking your medicine.  Thank you

## 2023-09-12 NOTE — Assessment & Plan Note (Signed)
 Chronic. Stable, continue current treatment

## 2023-09-12 NOTE — Assessment & Plan Note (Signed)
 Continue Cardizem  120 mg every day.

## 2023-09-13 ENCOUNTER — Ambulatory Visit
Admission: RE | Admit: 2023-09-13 | Discharge: 2023-09-13 | Disposition: A | Discharge status: Home / Self Care | Source: Ambulatory Visit | Attending: Family Medicine | Admitting: Family Medicine

## 2023-09-13 DIAGNOSIS — R1032 Left lower quadrant pain: Secondary | ICD-10-CM

## 2023-09-21 ENCOUNTER — Other Ambulatory Visit: Payer: Self-pay | Admitting: Family Medicine

## 2023-09-21 DIAGNOSIS — K219 Gastro-esophageal reflux disease without esophagitis: Secondary | ICD-10-CM

## 2023-09-25 NOTE — Progress Notes (Signed)
 CT of the abdomen was normal.  No abnormalities observed in any of the organs.

## 2023-10-03 ENCOUNTER — Encounter: Payer: Self-pay | Admitting: Gastroenterology

## 2023-10-07 ENCOUNTER — Ambulatory Visit (AMBULATORY_SURGERY_CENTER)

## 2023-10-07 VITALS — Ht 67.0 in | Wt 221.0 lb

## 2023-10-07 DIAGNOSIS — Z1211 Encounter for screening for malignant neoplasm of colon: Secondary | ICD-10-CM

## 2023-10-07 MED ORDER — NA SULFATE-K SULFATE-MG SULF 17.5-3.13-1.6 GM/177ML PO SOLN: 1.0000 | Freq: Once | ORAL | 0 refills | Status: AC

## 2023-10-07 NOTE — Progress Notes (Signed)

## 2023-10-10 ENCOUNTER — Encounter: Payer: Self-pay | Admitting: Gastroenterology

## 2023-10-18 ENCOUNTER — Ambulatory Visit (AMBULATORY_SURGERY_CENTER): Admitting: Gastroenterology

## 2023-10-18 ENCOUNTER — Encounter: Payer: Self-pay | Admitting: Gastroenterology

## 2023-10-18 VITALS — BP 122/90 | HR 70 | Temp 98.1°F | Resp 11 | Ht 67.0 in | Wt 221.0 lb

## 2023-10-18 DIAGNOSIS — D125 Benign neoplasm of sigmoid colon: Secondary | ICD-10-CM | POA: Diagnosis not present

## 2023-10-18 DIAGNOSIS — D127 Benign neoplasm of rectosigmoid junction: Secondary | ICD-10-CM | POA: Diagnosis not present

## 2023-10-18 DIAGNOSIS — Z1211 Encounter for screening for malignant neoplasm of colon: Secondary | ICD-10-CM | POA: Diagnosis present

## 2023-10-18 DIAGNOSIS — K635 Polyp of colon: Secondary | ICD-10-CM | POA: Diagnosis not present

## 2023-10-18 DIAGNOSIS — K648 Other hemorrhoids: Secondary | ICD-10-CM

## 2023-10-18 DIAGNOSIS — D124 Benign neoplasm of descending colon: Secondary | ICD-10-CM

## 2023-10-18 DIAGNOSIS — D128 Benign neoplasm of rectum: Secondary | ICD-10-CM | POA: Diagnosis not present

## 2023-10-18 DIAGNOSIS — D123 Benign neoplasm of transverse colon: Secondary | ICD-10-CM

## 2023-10-18 MED ORDER — SODIUM CHLORIDE 0.9 % IV SOLN: 500.0000 mL | Freq: Once | INTRAVENOUS | Status: DC

## 2023-10-18 NOTE — Progress Notes (Signed)
 Called to room to assist during endoscopic procedure.  Patient ID and intended procedure confirmed with present staff. Received instructions for my participation in the procedure from the performing physician.

## 2023-10-18 NOTE — Op Note (Signed)
 [facility] Patient Name: Aaron Mccoy Procedure Date: 10/18/2023 8:59 AM MRN: [id number] Endoscopist: Elspeth P. Leigh , MD, [id number] Age: 48 Referring MD:  Date of Birth: 31-Jan-1976 Gender: Male Account #: [id number] Procedure:                Colonoscopy Indications:              Screening for colorectal malignant neoplasm, This                            is the patient's first colonoscopy Medicines:                Monitored Anesthesia Care Procedure:                Pre-Anesthesia Assessment:                           - Prior to the procedure, a History and Physical                            was performed, and patient medications and                            allergies were reviewed. The patient's tolerance of                            previous anesthesia was also reviewed. The risks                            and benefits of the procedure and the sedation                            options and risks were discussed with the patient.                            All questions were answered, and informed consent                            was obtained. Prior Anticoagulants: The patient has                            taken no anticoagulant or antiplatelet agents. ASA                            Grade Assessment: II - A patient with mild systemic                            disease. After reviewing the risks and benefits,                            the patient was deemed in satisfactory condition to                            undergo the procedure.  After obtaining informed consent, the colonoscope                            was passed under direct vision. Throughout the                            procedure, the patient's blood pressure, pulse, and                            oxygen saturations were monitored continuously. The                            Olympus CF-HQ190L ([id number]) Colonoscope was                            introduced through  the anus and advanced to the the                            cecum, identified by appendiceal orifice and                            ileocecal valve. The colonoscopy was performed                            without difficulty. The patient tolerated the                            procedure well. The quality of the bowel                            preparation was good. The ileocecal valve,                            appendiceal orifice, and rectum were photographed. Scope In: 9:13:05 AM Scope Out: 9:33:52 AM Scope Withdrawal Time: 0 hours 15 minutes 55 seconds  Total Procedure Duration: 0 hours 20 minutes 47 seconds  Findings:                 The perianal and digital rectal examinations were                            normal.                           A 3 mm polyp was found in the transverse colon. The                            polyp was sessile. The polyp was removed with a                            cold snare. Resection and retrieval were complete.                           A 4 mm polyp was found in the splenic flexure. The  polyp was sessile. The polyp was removed with a                            cold snare. Resection and retrieval were complete.                           Two sessile polyps were found in the descending                            colon. The polyps were 3 mm in size. These polyps                            were removed with a cold snare. Resection and                            retrieval were complete.                           A 3 mm polyp was found in the sigmoid colon. The                            polyp was sessile. The polyp was removed with a                            cold snare. Resection and retrieval were complete.                           A 3 mm polyp was found in the rectum. The polyp was                            sessile. The polyp was removed with a cold snare.                            Resection and retrieval were complete.                            Internal hemorrhoids were found during retroflexion.                           The exam was otherwise without abnormality. Complications:            No immediate complications. Estimated blood loss:                            Minimal. Estimated Blood Loss:     Estimated blood loss was minimal. Impression:               - One 3 mm polyp in the transverse colon, removed                            with a cold snare. Resected and retrieved.                           -  One 4 mm polyp at the splenic flexure, removed                            with a cold snare. Resected and retrieved.                           - Two 3 mm polyps in the descending colon, removed                            with a cold snare. Resected and retrieved.                           - One 3 mm polyp in the sigmoid colon, removed with                            a cold snare. Resected and retrieved.                           - One 3 mm polyp in the rectum, removed with a cold                            snare. Resected and retrieved.                           - Internal hemorrhoids.                           - The examination was otherwise normal. Recommendation:           - Patient has a contact number available for                            emergencies. The signs and symptoms of potential                            delayed complications were discussed with the                            patient. Return to normal activities tomorrow.                            Written discharge instructions were provided to the                            patient.                           - Resume previous diet.                           - Continue present medications.                           - Await pathology results. Elspeth P. Alecsander Hattabaugh, MD 10/18/2023 9:42:05 AM This report has been signed electronically.

## 2023-10-18 NOTE — Patient Instructions (Signed)

## 2023-10-18 NOTE — Progress Notes (Signed)
 Pt's states no medical or surgical changes since previsit or office visit.

## 2023-10-18 NOTE — Progress Notes (Signed)
 [facility] History and Physical   Primary Care Physician:  Petrina Pries, NP   Reason for Procedure:   Colon cancer screening  Plan:    colonoscopy     HPI: Aaron Mccoy is a 48 y.o. male  here for colonoscopy screening - first time exam.   Patient denies any bowel symptoms at this time. No family history of colon cancer known. Otherwise feels well without any cardiopulmonary symptoms.   I have discussed risks / benefits of anesthesia and endoscopic procedure with Aaron Mccoy and they wish to proceed with the exams as outlined today.    Past Medical History:  Diagnosis Date   A-fib [facility])    Hypertension    Irregular heartbeat     History reviewed. No pertinent surgical history.  Prior to Admission medications   Medication Sig Start Date End Date Taking? Authorizing Provider  acetaminophen (TYLENOL) 325 MG tablet Take 1 tablet by mouth every 6 (six) hours as needed.   Yes [provider]  atorvastatin (LIPITOR) 20 MG tablet Take 1 tablet (20 mg total) by mouth daily. 09/12/23 09/11/24 Yes Petrina Pries, NP  cyclobenzaprine (FLEXERIL) 10 MG tablet Take 1 tablet (10 mg total) by mouth 2 (two) times daily as needed. 04/01/23  Yes Schutt, Marsa HERO, PA-C  diltiazem (CARDIZEM CD) 120 MG 24 hr capsule Take 1 capsule (120 mg total) by mouth daily. 12/26/22  Yes Celestia Rosaline SQUIBB, NP  famotidine (PEPCID) 20 MG tablet TAKE 1 TABLET BY MOUTH TWICE A DAY 09/23/23  Yes Petrina Pries, NP  hydrochlorothiazide (HYDRODIURIL) 12.5 MG tablet Take 1 tablet (12.5 mg total) by mouth daily. 07/09/23  Yes Petrina Pries, NP  methocarbamol (ROBAXIN) 500 MG tablet Take 1 tablet (500 mg total) by mouth every 8 (eight) hours as needed for muscle spasms. 12/26/22   Celestia Rosaline SQUIBB, NP    Current Outpatient Medications  Medication Sig Dispense Refill   acetaminophen (TYLENOL) 325 MG tablet Take 1 tablet by mouth every 6 (six) hours as needed.     atorvastatin (LIPITOR) 20 MG  tablet Take 1 tablet (20 mg total) by mouth daily. 90 tablet 3   cyclobenzaprine (FLEXERIL) 10 MG tablet Take 1 tablet (10 mg total) by mouth 2 (two) times daily as needed. 10 tablet 0   diltiazem (CARDIZEM CD) 120 MG 24 hr capsule Take 1 capsule (120 mg total) by mouth daily. 90 capsule 1   famotidine (PEPCID) 20 MG tablet TAKE 1 TABLET BY MOUTH TWICE A DAY 60 tablet 5   hydrochlorothiazide (HYDRODIURIL) 12.5 MG tablet Take 1 tablet (12.5 mg total) by mouth daily. 90 tablet 1   methocarbamol (ROBAXIN) 500 MG tablet Take 1 tablet (500 mg total) by mouth every 8 (eight) hours as needed for muscle spasms. 90 tablet 1   Current Facility-Administered Medications  Medication Dose Route Frequency Provider Last Rate Last Admin   0.9 %  sodium chloride infusion  500 mL Intravenous Once Zacari Radick, Elspeth SQUIBB, MD        Allergies as of 10/18/2023   (No Known Allergies)    Family History  Problem Relation Age of Onset   Hearing loss Mother    Colon cancer Neg Hx    Rectal cancer Neg Hx    Stomach cancer Neg Hx     Social History   Socioeconomic History   Marital status: Married    Spouse name: Not on file   Number of children: 8   Years of education: Not  on file   Highest education level: Some college, no degree  Occupational History   Not on file  Tobacco Use   Smoking status: Former    Current packs/day: 1.00    Types: Cigarettes, Cigars   Smokeless tobacco: Never  Vaping Use   Vaping status: Former   Substances: Flavoring  Substance and Sexual Activity   Alcohol use: Yes   Drug use: Not Currently    Types: Marijuana, Cocaine   Sexual activity: Yes    Birth control/protection: None  Other Topics Concern   Not on file  Social History Narrative   Not on file   Social Drivers of Health   Financial Resource Strain: Medium Risk (09/05/2023)   Overall Financial Resource Strain (CARDIA)    Difficulty of Paying Living Expenses: Somewhat hard  Food Insecurity: Food Insecurity  Present (09/05/2023)   Hunger Vital Sign    Worried About Running Out of Food in the Last Year: Never true    Ran Out of Food in the Last Year: Sometimes true  Transportation Needs: No Transportation Needs (09/05/2023)   PRAPARE - Administrator, Civil Service (Medical): No    Lack of Transportation (Non-Medical): No  Physical Activity: Sufficiently Active (09/05/2023)   Exercise Vital Sign    Days of Exercise per Week: 5 days    Minutes of Exercise per Session: 30 min  Stress: No Stress Concern Present (09/05/2023)   [employer] of Occupational Health - Occupational Stress Questionnaire    Feeling of Stress: Not at all  Social Connections: Moderately Integrated (09/05/2023)   Social Connection and Isolation Panel    Frequency of Communication with Friends and Family: Twice a week    Frequency of Social Gatherings with Friends and Family: Twice a week    Attends Religious Services: More than 4 times per year    Active Member of [employer] or Organizations: No    Attends Engineer, structural: Not on file    Marital Status: Married  Catering manager Violence: Not on file    Review of Systems: All other review of systems negative except as mentioned in the HPI.  Physical Exam: Vital signs BP (!) 138/105   Pulse 85   Temp 98.1 F (36.7 C) (Skin)   Ht 5' 7 (1.702 m)   Wt 221 lb (100.2 kg)   SpO2 98%   BMI 34.61 kg/m   General:   Alert,  Well-developed, pleasant and cooperative in NAD Lungs:  Clear throughout to auscultation.   Heart:  Regular rate and rhythm Abdomen:  Soft, nontender and nondistended.   Neuro/Psych:  Alert and cooperative. Normal mood and affect. A and O x 3  Marcey Naval, MD [facility]

## 2023-10-18 NOTE — Progress Notes (Signed)
 Report given to PACU, vss

## 2023-10-21 ENCOUNTER — Telehealth: Payer: Self-pay

## 2023-10-21 NOTE — Telephone Encounter (Signed)
 Follow up call to pt, lm for pt to call if having any difficulty with normal activities or eating and drinking.  Also to call if any other questions or concerns.

## 2023-10-23 ENCOUNTER — Ambulatory Visit: Payer: Self-pay | Admitting: Gastroenterology

## 2023-10-23 ENCOUNTER — Other Ambulatory Visit: Payer: Self-pay | Admitting: Family Medicine

## 2023-10-23 DIAGNOSIS — I480 Paroxysmal atrial fibrillation: Secondary | ICD-10-CM

## 2023-10-23 LAB — SURGICAL PATHOLOGY

## 2023-10-28 ENCOUNTER — Ambulatory Visit

## 2023-10-28 ENCOUNTER — Encounter: Payer: Self-pay | Admitting: Nurse Practitioner

## 2023-10-28 ENCOUNTER — Ambulatory Visit: Attending: Nurse Practitioner | Admitting: Nurse Practitioner

## 2023-10-28 VITALS — BP 122/85 | HR 96 | Ht 67.0 in | Wt 220.0 lb

## 2023-10-28 DIAGNOSIS — R0789 Other chest pain: Secondary | ICD-10-CM

## 2023-10-28 DIAGNOSIS — I10 Essential (primary) hypertension: Secondary | ICD-10-CM | POA: Diagnosis not present

## 2023-10-28 DIAGNOSIS — E782 Mixed hyperlipidemia: Secondary | ICD-10-CM | POA: Diagnosis not present

## 2023-10-28 DIAGNOSIS — I480 Paroxysmal atrial fibrillation: Secondary | ICD-10-CM | POA: Diagnosis not present

## 2023-10-28 DIAGNOSIS — Z720 Tobacco use: Secondary | ICD-10-CM

## 2023-10-28 NOTE — Patient Instructions (Signed)
 Medication Instructions:  Your physician recommends that you continue on your current medications as directed. Please refer to the Current Medication list given to you today.  *If you need a refill on your cardiac medications before your next appointment, please call your pharmacy*  Lab Work: NONE ordered at this time of appointment   Testing/Procedures: ZIO AT Long term monitor-Live Telemetry  Your physician has requested you wear a ZIO patch monitor for 14 days.  This is a single patch monitor. [employer] supplies one patch monitor per enrollment. Additional  stickers are not available.  Please do not apply patch if you will be having a Nuclear Stress Test, Echocardiogram, Cardiac CT, MRI,  or Chest Xray during the period you would be wearing the monitor. The patch cannot be worn during  these tests. You cannot remove and re-apply the ZIO AT patch monitor.  Your ZIO patch monitor will be mailed 3 day USPS to your address on file. It may take 3-5 days to  receive your monitor after you have been enrolled.  Once you have received your monitor, please review the enclosed instructions. Your monitor has  already been registered assigning a specific monitor serial # to you.   Billing and Patient Assistance Program information  [place] has been supplied with any insurance information on record for billing. [employer] offers a sliding scale Patient Assistance Program for patients without insurance, or whose  insurance does not completely cover the cost of the ZIO patch monitor. You must apply for the  Patient Assistance Program to qualify for the discounted rate. To apply, call [employer] at [phone],  select option 4, select option 2 , ask to apply for the Patient Assistance Program, (you can request an  interpreter if needed). [employer] will ask your household income and how many people are in your  household. [employer] will quote your out-of-pocket cost based on this information. They will also be  able  to set up a 12 month interest free payment plan if needed.  Applying the monitor   Shave hair from upper left chest.  Hold the abrader disc by orange tab. Rub the abrader in 40 strokes over left upper chest as indicated in  your monitor instructions.  Clean area with 4 enclosed alcohol pads. Use all pads to ensure the area is cleaned thoroughly. Let  dry.  Apply patch as indicated in monitor instructions. Patch will be placed under collarbone on left side of  chest with arrow pointing upward.  Rub patch adhesive wings for 2 minutes. Remove the white label marked 1. Remove the white label  marked 2. Rub patch adhesive wings for 2 additional minutes.  While looking in a mirror, press and release button in center of patch. A small green light will flash 3-4  times. This will be your only indicator that the monitor has been turned on.  Do not shower for the first 24 hours. You may shower after the first 24 hours.  Press the button if you feel a symptom. You will hear a small click. Record Date, Time and Symptom in  the Patient Log.   Starting the Gateway  In your kit there is a Audiological scientist box the size of a cellphone. This is Buyer, retail. It transmits all your  recorded data to [facility]. This box must always stay within 10 feet of you. Open the box and push the *  button. There will be a light that blinks orange and then green a few times. When the  light stops  blinking, the Gateway is connected to the ZIO patch. Call [employer] at [phone] to confirm your monitor is transmitting.  Returning your monitor  Remove your patch and place it inside the Gateway. In the lower half of the Gateway there is a white  bag with prepaid postage on it. Place Gateway in bag and seal. Mail package back to [place] as soon as  possible. Your physician should have your final report approximately 7 days after you have mailed back  your monitor. Call [facility] Customer Care at  [phone] if you have questions regarding your ZIO AT  patch monitor. Call them immediately if you see an orange light blinking on your monitor.  If your monitor falls off in less than 4 days, contact our Monitor department at [phone]. If your  monitor becomes loose or falls off after 4 days call [employer] at [phone] for suggestions on  securing your monitor   Follow-Up: At [facility], you and your health needs are our priority.  As part of our continuing mission to provide you with exceptional heart care, our providers are all part of one team.  This team includes your primary Cardiologist (physician) and Advanced Practice Providers or APPs (Physician Assistants and Nurse Practitioners) who all work together to provide you with the care you need, when you need it.  Your next appointment:   6-8 week(s)  Provider:   Dr. Michele or Damien Braver, NP          We recommend signing up for the patient portal called MyChart.  Sign up information is provided on this After Visit Summary.  MyChart is used to connect with patients for Virtual Visits (Telemedicine).  Patients are able to view lab/test results, encounter notes, upcoming appointments, etc.  Non-urgent messages can be sent to your provider as well.   To learn more about what you can do with MyChart, go to [email or web address].

## 2023-10-28 NOTE — Progress Notes (Unsigned)
 Enrolled patient for a 14 day Zio XT monitor to be mailed to patients home  [place] to read

## 2023-10-28 NOTE — Progress Notes (Signed)
 Office Visit    Patient Name: Aaron Mccoy Date of Encounter: 10/28/2023  Primary Care Provider:  Petrina Pries, NP Primary Cardiologist:  Madonna Large, DO  Chief Complaint    48 year old male with a history of paroxysmal atrial fibrillation, hypertension, hyperlipidemia, and GERD who presents for heart first clinic new patient evaluation.  Past Medical History    Past Medical History:  Diagnosis Date   A-fib (HCC)    Hypertension    Irregular heartbeat    No past surgical history on file.  Allergies  No Known Allergies   Labs/Other Studies Reviewed    The following studies were reviewed today:     Recent Labs: 09/05/2023: ALT 27; BUN 15; Creatinine, Ser 1.10; Hemoglobin 14.5; Platelets 267; Potassium 4.6; Sodium 140  Recent Lipid Panel    Component Value Date/Time   CHOL 115 09/05/2023 0924   TRIG 54 09/05/2023 0924   HDL 37 (L) 09/05/2023 0924   CHOLHDL 3.1 09/05/2023 0924   LDLCALC 66 09/05/2023 0924    History of Present Illness    48 year old male with the above past medical history including paroxysmal atrial fibrillation, hypertension, hyperlipidemia, and GERD.  He presents today for heart first clinic new patient evaluation.  He has a history of atrial fibrillation, per patient, this was diagnosed somewhere around 2018.  He is no longer on anticoagulation (CHA2DS2-VASc Score = 1). Per patient, he has a family history of CAD, his mother and his father both had stents placed in their early 4s. He reports a 1 to 2-year history of intermittent sharp chest pain when lying down, with associated shortness of breath. Symptoms occur only at rest, last for less than 5 minutes at a time, occur 2-3 times a week.  He denies any exertional symptoms.  He denies any palpitations, dizziness, presyncope, syncope.  He has also been experiencing some left lower quadrant abdominal pain.  Recent CT of the abdomen/pelvis in 09/2023 was unremarkable. He drinks 1 cup of coffee a  day. He used to smoke cigarettes, 2 packs/day x 15 to 20 years.  He now vapes, but hopes to quit.  He does not exercise regularly.  He is originally from [place] .  He is married, and has 8 children.  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   Home Medications    Current Outpatient Medications  Medication Sig Dispense Refill   acetaminophen  (TYLENOL ) 325 MG tablet Take 1 tablet by mouth every 6 (six) hours as needed.     atorvastatin  (LIPITOR) 20 MG tablet Take 1 tablet (20 mg total) by mouth daily. 90 tablet 3   cyclobenzaprine  (FLEXERIL ) 10 MG tablet Take 1 tablet (10 mg total) by mouth 2 (two) times daily as needed. 10 tablet 0   diltiazem  (CARDIZEM  CD) 120 MG 24 hr capsule Take 1 capsule (120 mg total) by mouth daily. 90 capsule 1   famotidine  (PEPCID ) 20 MG tablet TAKE 1 TABLET BY MOUTH TWICE A DAY 60 tablet 5   hydrochlorothiazide  (HYDRODIURIL ) 12.5 MG tablet Take 1 tablet (12.5 mg total) by mouth daily. 90 tablet 1   methocarbamol  (ROBAXIN ) 500 MG tablet Take 1 tablet (500 mg total) by mouth every 8 (eight) hours as needed for muscle spasms. 90 tablet 1   No current facility-administered medications for this visit.     Review of Systems    He denies palpitations, pnd, orthopnea, n, v, dizziness, syncope, edema, weight gain, or early satiety. All other systems  reviewed and are otherwise negative except as noted above.   Physical Exam    VS:  BP 122/85   Pulse 96   Ht 5' 7 (1.702 m)   Wt 220 lb (99.8 kg)   SpO2 99%   BMI 34.46 kg/m   GEN: Well nourished, well developed, in no acute distress. HEENT: normal. Neck: Supple, no JVD, carotid bruits, or masses. Cardiac: RRR, no murmurs, rubs, or gallops. No clubbing, cyanosis, edema.  Radials/DP/PT 2+ and equal bilaterally.  Respiratory:  Respirations regular and unlabored, clear to auscultation bilaterally. GI: Soft, nontender, nondistended, BS + x 4. MS: no deformity or atrophy. Skin: warm and  dry, no rash. Neuro:  Strength and sensation are intact. Psych: Normal affect.  Accessory Clinical Findings    ECG personally reviewed by me today - EKG Interpretation Date/Time:  Monday October 28 2023 07:54:43 EDT Ventricular Rate:  96 PR Interval:  158 QRS Duration:  86 QT Interval:  354 QTC Calculation: 447 R Axis:   35  Text Interpretation: Normal sinus rhythm Cannot rule out Anterior infarct , age undetermined When compared with ECG of 01-Apr-2023 11:53, PREVIOUS ECG IS PRESENT Confirmed by Daneen Perkins ([id number]) on 10/28/2023 8:22:28 AM  - no acute changes.   Lab Results  Component Value Date   WBC 5.1 09/05/2023   HGB 14.5 09/05/2023   HCT 45.3 09/05/2023   MCV 88 09/05/2023   PLT 267 09/05/2023   Lab Results  Component Value Date   CREATININE 1.10 09/05/2023   BUN 15 09/05/2023   NA 140 09/05/2023   K 4.6 09/05/2023   CL 103 09/05/2023   CO2 22 09/05/2023   Lab Results  Component Value Date   ALT 27 09/05/2023   AST 27 09/05/2023   ALKPHOS 73 09/05/2023   BILITOT 0.6 09/05/2023   Lab Results  Component Value Date   CHOL 115 09/05/2023   HDL 37 (L) 09/05/2023   LDLCALC 66 09/05/2023   TRIG 54 09/05/2023   CHOLHDL 3.1 09/05/2023    Lab Results  Component Value Date   HGBA1C 6.1 (H) 09/05/2023    Assessment & Plan   1. Paroxysmal atrial fibrillation/atypical chest pain: Per patient, a fib diagnosed somewhere around 2018.  Previously took Xarelto, he is no longer on anticoagulation (CHA2DS2-VASc= 1). He reports a 1 to 2-year history of intermittent sharp chest pain when lying down, with associated shortness of breath. Symptoms occur only at rest, last for less than 5 minutes at a time, occur 2-3 times a week.  He denies any exertional symptoms. Maintaining sinus rhythm. Euvolemic and well compensated on exam. Through shared decision making, will check 14-day ZIO monitor to screen for recurrent atrial fibrillation or other arrhythmia.  We discussed the  possibility of additional testing if monitor unremarkable and symptoms persist (possible echo, coronary CT angiogram).  He declines any additional testing today.  Continue diltiazem .  2. Hypertension: BP well controlled. Continue current antihypertensive regimen.   3. Hyperlipidemia: LDL was 66 in 09/2023.  Continue atorvastatin .  Monitored per PCP.  4. Tobacco use: He currently vapes.  Full cessation advised.  5. Disposition: Follow-up in 6 to 8 weeks.      Perkins JAYSON Daneen, NP 10/28/2023, 11:29 AM

## 2023-11-12 ENCOUNTER — Encounter: Payer: Self-pay | Admitting: Family Medicine

## 2023-11-12 ENCOUNTER — Other Ambulatory Visit (INDEPENDENT_AMBULATORY_CARE_PROVIDER_SITE_OTHER): Payer: Self-pay | Admitting: Primary Care

## 2023-11-12 ENCOUNTER — Other Ambulatory Visit: Payer: Self-pay

## 2023-11-12 DIAGNOSIS — I10 Essential (primary) hypertension: Secondary | ICD-10-CM

## 2023-11-12 MED ORDER — HYDROCHLOROTHIAZIDE 12.5 MG PO TABS: 12.5000 mg | ORAL_TABLET | Freq: Every day | ORAL | 1 refills | Status: DC

## 2023-11-15 ENCOUNTER — Other Ambulatory Visit: Payer: Self-pay

## 2023-11-15 DIAGNOSIS — I10 Essential (primary) hypertension: Secondary | ICD-10-CM

## 2023-11-15 MED ORDER — HYDROCHLOROTHIAZIDE 12.5 MG PO TABS: 12.5000 mg | ORAL_TABLET | Freq: Every day | ORAL | 1 refills | Status: DC

## 2023-11-18 ENCOUNTER — Other Ambulatory Visit: Payer: Self-pay

## 2023-11-18 MED ORDER — METHOCARBAMOL 500 MG PO TABS: 500.0000 mg | ORAL_TABLET | Freq: Three times a day (TID) | ORAL | 1 refills | Status: DC | PRN

## 2023-11-19 ENCOUNTER — Other Ambulatory Visit (INDEPENDENT_AMBULATORY_CARE_PROVIDER_SITE_OTHER): Payer: Self-pay | Admitting: Family Medicine

## 2023-11-19 DIAGNOSIS — I10 Essential (primary) hypertension: Secondary | ICD-10-CM

## 2023-11-20 ENCOUNTER — Other Ambulatory Visit: Payer: Self-pay

## 2023-11-20 DIAGNOSIS — I10 Essential (primary) hypertension: Secondary | ICD-10-CM

## 2023-11-20 MED ORDER — HYDROCHLOROTHIAZIDE 12.5 MG PO TABS
12.5000 mg | ORAL_TABLET | Freq: Every day | ORAL | 1 refills | Status: DC
Start: 2023-11-20 — End: 2024-01-08

## 2023-11-20 MED ORDER — METHOCARBAMOL 500 MG PO TABS: 500.0000 mg | ORAL_TABLET | Freq: Three times a day (TID) | ORAL | 1 refills | Status: DC | PRN

## 2023-11-23 DIAGNOSIS — I480 Paroxysmal atrial fibrillation: Secondary | ICD-10-CM | POA: Diagnosis not present

## 2023-12-06 ENCOUNTER — Ambulatory Visit: Payer: Self-pay | Admitting: Nurse Practitioner

## 2023-12-09 ENCOUNTER — Ambulatory Visit: Attending: Cardiology | Admitting: Cardiology

## 2023-12-09 VITALS — BP 126/80 | HR 88 | Ht 68.0 in | Wt 225.0 lb

## 2023-12-09 DIAGNOSIS — E782 Mixed hyperlipidemia: Secondary | ICD-10-CM

## 2023-12-09 DIAGNOSIS — Z720 Tobacco use: Secondary | ICD-10-CM

## 2023-12-09 DIAGNOSIS — R0789 Other chest pain: Secondary | ICD-10-CM

## 2023-12-09 DIAGNOSIS — I480 Paroxysmal atrial fibrillation: Secondary | ICD-10-CM | POA: Diagnosis not present

## 2023-12-09 DIAGNOSIS — I10 Essential (primary) hypertension: Secondary | ICD-10-CM | POA: Diagnosis not present

## 2023-12-09 MED ORDER — DILTIAZEM HCL ER COATED BEADS 240 MG PO CP24: 240.0000 mg | ORAL_CAPSULE | Freq: Every day | ORAL | 3 refills | Status: AC

## 2023-12-09 NOTE — Telephone Encounter (Signed)
 This encounter was created in error - please disregard.

## 2023-12-09 NOTE — Patient Instructions (Addendum)
 Medication Instructions:  Please increase your Cardizem  (Diltiazem ) 240 mg a day. Continue all other medications as listed.  *If you need a refill on your cardiac medications before your next appointment, please call your pharmacy*  Testing/Procedures: Your physician has requested that you have an echocardiogram. Echocardiography is a painless test that uses sound waves to create images of your heart. It provides your doctor with information about the size and shape of your heart and how well your heart's chambers and valves are working. This procedure takes approximately one hour. There are no restrictions for this procedure. Please do NOT wear cologne, perfume, aftershave, or lotions (deodorant is allowed). Please arrive 15 minutes prior to your appointment time.  Please note: We ask at that you not bring children with you during ultrasound (echo/ vascular) testing. Due to room size and safety concerns, children are not allowed in the ultrasound rooms during exams. Our front office staff cannot provide observation of children in our lobby area while testing is being conducted. An adult accompanying a patient to their appointment will only be allowed in the ultrasound room at the discretion of the ultrasound technician under special circumstances. We apologize for any inconvenience.  Follow-Up: At [facility], you and your health needs are our priority.  As part of our continuing mission to provide you with exceptional heart care, our providers are all part of one team.  This team includes your primary Cardiologist (physician) and Advanced Practice Providers or APPs (Physician Assistants and Nurse Practitioners) who all work together to provide you with the care you need, when you need it.  Your next appointment:   1 year(s)  Provider:   Madonna Large, DO    We recommend signing up for the patient portal called MyChart.  Sign up information is provided on this After Visit Summary.   MyChart is used to connect with patients for Virtual Visits (Telemedicine).  Patients are able to view lab/test results, encounter notes, upcoming appointments, etc.  Non-urgent messages can be sent to your provider as well.   To learn more about what you can do with MyChart, go to [email or web address].

## 2023-12-09 NOTE — Telephone Encounter (Signed)
-----   Message from Olam ORN sent at 12/09/2023 10:47 AM EDT ----- Patients echo order will not link to the appt.. Can you recreate a new order and link please.

## 2023-12-09 NOTE — Progress Notes (Signed)
 Cardiology Office Note:  .   ID:  Aaron Mccoy, DOB Sep 18, 1975, MRN [id number] PCP:  Petrina Pries, NP  Former Cardiology Providers: None [facility] Providers Cardiologist:  Madonna Large, DO , [facility] (established care 12/09/23) Electrophysiologist:  None  Click to update primary MD,subspecialty MD or APP then REFRESH:1}    Chief Complaint  Patient presents with   Follow-up    Hx of paroxysmal Afib and re-evaluate chest pain    History of Present Illness: .   Aaron Mccoy is a 48 y.o. African-American male whose past medical history and cardiovascular risk factors includes: Paroxysmal atrial fibrillation, hypertension, hyperlipidemia, GERD, family history of CAD, former cigarette smoking (2 packs/day x 15 to 20 years), vaping nicotine.  Patient was referred to the practice for evaluation and management of paroxysmal atrial fibrillation.  Initially seen in July Damien Braver, NP and I am seeing him for the first time.  According the patient he was diagnosed with paroxysmal atrial fibrillation around 2018.  No longer anticoagulation given his CHA2DS2-VASc score.  During his last office visit patient was endorsing precordial discomfort predominantly noncardiac and shared decision was to proceed with a cardiac monitor to evaluate for A-fib burden.  Overall cardiac monitor is unremarkable then echo and coronary CTA could be considered as part of precordial pain workup.  He presents today for follow-up.  Patient denies anginal chest pain or heart failure symptoms. Still appreciates episodes of palpitations but overall intensity frequency and duration has improved.   Currently on Cardizem  120 mg p.o. daily  Review of Systems: .   Review of Systems  Cardiovascular:  Negative for chest pain, claudication, irregular heartbeat, leg swelling, near-syncope, orthopnea, palpitations, paroxysmal nocturnal dyspnea and syncope.  Respiratory:  Negative for shortness of breath.    Hematologic/Lymphatic: Negative for bleeding problem.    Studies Reviewed:   EKG: October 28, 2023: Sinus rhythm, 96 bpm, without underlying ischemia or injury pattern  Echocardiogram: NA  Stress Testing: NA  Cardiac monitor (Zio Patch): 11/04/2023 - 11/15/2023 Dominant rhythm sinus rhythm.  Tachycardia burden 22%.  Heart rate 38-190 bpm.  Avg HR 88 bpm. No atrial fibrillation detected during the monitoring period. No ventricular tachycardia, high grade AV block, pauses (3 seconds or longer). Rare episodes of PSVT, auto triggered events. Total supraventricular ectopic burden <1%. Total ventricular ectopic burden 1.3%.  Patient triggered events: 5.  Underlying rhythm sinus with rare PACs/PVCs.  RADIOLOGY: NA  Risk Assessment/Calculations:   Click Here to Calculate/Change CHADS2VASc Score The patient's CHADS2-VASc score is 1, indicating a 0.6% annual risk of stroke.  Therefore, anticoagulation is not recommended.   CHF History: No HTN History: Yes Diabetes History: No Stroke History: No Vascular Disease History: No  Labs:       Latest Ref Rng & Units 09/05/2023    9:24 AM 04/01/2023   11:42 AM 03/08/2023   11:36 AM  CBC  WBC 3.4 - 10.8 x10E3/uL 5.1  5.9  5.1   Hemoglobin 13.0 - 17.7 g/dL 85.4  85.9  85.6   Hematocrit 37.5 - 51.0 % 45.3  43.2  44.8   Platelets 150 - 450 x10E3/uL 267  238  275        Latest Ref Rng & Units 09/05/2023    9:24 AM 04/01/2023   11:42 AM 03/08/2023   11:36 AM  BMP  Glucose 70 - 99 mg/dL 90  898  898   BUN 6 - 24 mg/dL 15  13  12    Creatinine  0.76 - 1.27 mg/dL 8.89  8.84  8.68   BUN/Creat Ratio 9 - 20 14   9    Sodium 134 - 144 mmol/L 140  139  141   Potassium 3.5 - 5.2 mmol/L 4.6  3.8  4.2   Chloride 96 - 106 mmol/L 103  105  102   CO2 20 - 29 mmol/L 22  24  24    Calcium  8.7 - 10.2 mg/dL 89.5  9.4  89.9       Latest Ref Rng & Units 09/05/2023    9:24 AM 04/01/2023   11:42 AM 03/08/2023   11:36 AM  CMP  Glucose 70 - 99 mg/dL 90  898   898   BUN 6 - 24 mg/dL 15  13  12    Creatinine 0.76 - 1.27 mg/dL 8.89  8.84  8.68   Sodium 134 - 144 mmol/L 140  139  141   Potassium 3.5 - 5.2 mmol/L 4.6  3.8  4.2   Chloride 96 - 106 mmol/L 103  105  102   CO2 20 - 29 mmol/L 22  24  24    Calcium  8.7 - 10.2 mg/dL 89.5  9.4  89.9   Total Protein 6.0 - 8.5 g/dL 7.3   7.4   Total Bilirubin 0.0 - 1.2 mg/dL 0.6   0.4   Alkaline Phos 44 - 121 IU/L 73   76   AST 0 - 40 IU/L 27   28   ALT 0 - 44 IU/L 27   27     Lab Results  Component Value Date   CHOL 115 09/05/2023   HDL 37 (L) 09/05/2023   LDLCALC 66 09/05/2023   TRIG 54 09/05/2023   CHOLHDL 3.1 09/05/2023   No results for input(s): LIPOA in the last 8760 hours. No components found for: NTPROBNP No results for input(s): PROBNP in the last 8760 hours. No results for input(s): TSH in the last 8760 hours.  Physical Exam:    Today's Vitals   12/09/23 0803  BP: 126/80  Pulse: 88  SpO2: 97%  Weight: 225 lb (102.1 kg)  Height: 5' 8 (1.727 m)   Body mass index is 34.21 kg/m. Wt Readings from Last 3 Encounters:  12/09/23 225 lb (102.1 kg)  10/28/23 220 lb (99.8 kg)  10/18/23 221 lb (100.2 kg)    Physical Exam  Constitutional: No distress.  hemodynamically stable  Neck: No JVD present.  Cardiovascular: Normal rate, regular rhythm, S1 normal and S2 normal. Exam reveals no gallop, no S3 and no S4.  No murmur heard. Pulmonary/Chest: Effort normal and breath sounds normal. No stridor. He has no wheezes. He has no rales.  Musculoskeletal:        General: No edema.     Cervical back: Neck supple.  Skin: Skin is warm.     Impression & Recommendation(s):  Impression:   ICD-10-CM   1. Paroxysmal atrial fibrillation (HCC)  I48.0 diltiazem  (CARDIZEM  CD) 240 MG 24 hr capsule    CANCELED: ECHOCARDIOGRAM COMPLETE    2. Atypical chest pain  R07.89 ECHOCARDIOGRAM COMPLETE    CANCELED: ECHOCARDIOGRAM COMPLETE    3. Primary hypertension  I10     4. Mixed hyperlipidemia   E78.2     5. Tobacco use  Z72.0        Recommendation(s):  Paroxysmal atrial fibrillation (HCC) Rate control: Cardizem . Rhythm control: N/A. Thromboembolic prophylaxis: N/A. Had a diagnosis of paroxysmal atrial fibrillation back in 2018.  Was on anticoagulation for  short period of time and later discontinued due to low CHA2DS2-VASc score. Cardiac monitor since last office visit does not illustrate episodes of A-fib. He does have an elevated tachycardia burden which should be contributory to his symptoms of palpitations.  Shared decision was to uptitrate Cardizem  from 120 mg p.o. daily to 240 mg p.o. daily.  Atypical chest pain At the last office visit endorsed episodes of chest pain predominantly noncardiac. No workup was performed. Today he denies any anginal discomfort. We discussed continuing to monitor symptoms versus additional workup such as echo/GXT.  Patient prefers to proceed forward with echocardiogram.  If echocardiogram findings are concerning stress test could be considered.  Primary hypertension Office blood pressures are very well-controlled. Continue hydrochlorothiazide  12.5 mg p.o. daily  Mixed hyperlipidemia Continue Lipitor 20 mg p.o. nightly. LDL 66 mg/dL as of July 2025  Tobacco use Used to smoke approximately 2 packs/day. Now weaning off of vaping.  He is quite motivated.   Orders Placed:  Orders Placed This Encounter  Procedures   ECHOCARDIOGRAM COMPLETE    Standing Status:   Future    Expected Date:   12/16/2023    Expiration Date:   12/08/2024    Where [facility] this test [facility] performed[facility]    Does the patient weigh less than or greater than 250 lbs?:   Patient weighs less than 250 lbs    Perflutren DEFINITY (image enhancing agent) should be administered unless hypersensitivity or allergy exist:   Administer Perflutren    Reason for exam-Echo:   Chest Pain  R07.9     Final Medication List:    Meds ordered this encounter   Medications   diltiazem  (CARDIZEM  CD) 240 MG 24 hr capsule    Sig: Take 1 capsule (240 mg total) by mouth daily.    Dispense:  90 capsule    Refill:  3    Medications Discontinued During This Encounter  Medication Reason   diltiazem  (CARDIZEM  CD) 120 MG 24 hr capsule Dose change     Current Outpatient Medications:    acetaminophen  (TYLENOL ) 325 MG tablet, Take 1 tablet by mouth every 6 (six) hours as needed., Disp: , Rfl:    atorvastatin  (LIPITOR) 20 MG tablet, Take 1 tablet (20 mg total) by mouth daily., Disp: 90 tablet, Rfl: 3   cyclobenzaprine  (FLEXERIL ) 10 MG tablet, Take 1 tablet (10 mg total) by mouth 2 (two) times daily as needed., Disp: 10 tablet, Rfl: 0   diltiazem  (CARDIZEM  CD) 240 MG 24 hr capsule, Take 1 capsule (240 mg total) by mouth daily., Disp: 90 capsule, Rfl: 3   famotidine  (PEPCID ) 20 MG tablet, TAKE 1 TABLET BY MOUTH TWICE A DAY, Disp: 60 tablet, Rfl: 5   hydrochlorothiazide  (HYDRODIURIL ) 12.5 MG tablet, Take 1 tablet (12.5 mg total) by mouth daily., Disp: 90 tablet, Rfl: 1   methocarbamol  (ROBAXIN ) 500 MG tablet, Take 1 tablet (500 mg total) by mouth every 8 (eight) hours as needed for muscle spasms., Disp: 90 tablet, Rfl: 1  Consent:   NA  Disposition:   1 year follow-up sooner if needed  His questions and concerns were addressed to his satisfaction. He voices understanding of the recommendations provided during this encounter.    Signed, Madonna Michele HAS, [facility]  A Division of [facility] [address], [place], [place] [address]  12/15/2023 12:54 AM

## 2023-12-15 ENCOUNTER — Encounter: Payer: Self-pay | Admitting: Cardiology

## 2024-01-08 ENCOUNTER — Ambulatory Visit (INDEPENDENT_AMBULATORY_CARE_PROVIDER_SITE_OTHER): Payer: Self-pay | Admitting: Family Medicine

## 2024-01-08 VITALS — BP 120/76 | HR 84 | Temp 98.3°F | Ht 68.0 in | Wt 227.0 lb

## 2024-01-08 DIAGNOSIS — E7849 Other hyperlipidemia: Secondary | ICD-10-CM

## 2024-01-08 DIAGNOSIS — I480 Paroxysmal atrial fibrillation: Secondary | ICD-10-CM

## 2024-01-08 DIAGNOSIS — E66811 Obesity, class 1: Secondary | ICD-10-CM

## 2024-01-08 DIAGNOSIS — E538 Deficiency of other specified B group vitamins: Secondary | ICD-10-CM

## 2024-01-08 DIAGNOSIS — R7309 Other abnormal glucose: Secondary | ICD-10-CM

## 2024-01-08 DIAGNOSIS — I10 Essential (primary) hypertension: Secondary | ICD-10-CM

## 2024-01-08 DIAGNOSIS — Z6834 Body mass index (BMI) 34.0-34.9, adult: Secondary | ICD-10-CM

## 2024-01-08 DIAGNOSIS — Z2821 Immunization not carried out because of patient refusal: Secondary | ICD-10-CM

## 2024-01-08 DIAGNOSIS — K219 Gastro-esophageal reflux disease without esophagitis: Secondary | ICD-10-CM

## 2024-01-08 DIAGNOSIS — Z1159 Encounter for screening for other viral diseases: Secondary | ICD-10-CM

## 2024-01-08 DIAGNOSIS — E6609 Other obesity due to excess calories: Secondary | ICD-10-CM

## 2024-01-08 MED ORDER — ATORVASTATIN CALCIUM 20 MG PO TABS: 20.0000 mg | ORAL_TABLET | Freq: Every day | ORAL | 3 refills | Status: AC

## 2024-01-08 MED ORDER — HYDROCHLOROTHIAZIDE 12.5 MG PO TABS: 12.5000 mg | ORAL_TABLET | Freq: Every day | ORAL | 2 refills | Status: AC

## 2024-01-08 MED ORDER — FAMOTIDINE 20 MG PO TABS: 20.0000 mg | ORAL_TABLET | Freq: Two times a day (BID) | ORAL | 2 refills | Status: AC

## 2024-01-08 NOTE — Assessment & Plan Note (Addendum)
-  Diagnosed via Zio patch. Managed with Cardizem . Echocardiogram scheduled to assess heart function. - Continue Cardizem  extended release 240 mg daily. - Perform echocardiogram on November 18th.

## 2024-01-08 NOTE — Assessment & Plan Note (Signed)
 Weight gain noted. Dietary habits contribute to weight gain. - Encouraged dietary modifications to reduce fried foods and refined carbohydrates. - Advised on portion control and balanced meals with lean proteins and vegetables.

## 2024-01-08 NOTE — Assessment & Plan Note (Signed)
-  Hypertension managed with Cardizem  and hydrochlorothiazide .  -Recent medication adjustment by cardiologist.

## 2024-01-08 NOTE — Assessment & Plan Note (Signed)
 A1c is 6.1, indicating prediabetes. Weight gain noted. Dietary habits contribute to elevated A1c. - Encouraged dietary modifications to reduce refined carbohydrates and increase fruits and vegetables. - Advised on portion control and balanced meals with lean proteins and vegetables.

## 2024-01-08 NOTE — Patient Instructions (Signed)
 Hypertension, Adult Hypertension is another name for high blood pressure. High blood pressure forces your heart to work harder to pump blood. This can cause problems over time. There are two numbers in a blood pressure reading. There is a top number (systolic) over a bottom number (diastolic). It is best to have a blood pressure that is below 120/80. What are the causes? The cause of this condition is not known. Some other conditions can lead to high blood pressure. What increases the risk? Some lifestyle factors can make you more likely to develop high blood pressure: Smoking. Not getting enough exercise or physical activity. Being overweight. Having too much fat, sugar, calories, or salt (sodium) in your diet. Drinking too much alcohol. Other risk factors include: Having any of these conditions: Heart disease. Diabetes. High cholesterol. Kidney disease. Obstructive sleep apnea. Having a family history of high blood pressure and high cholesterol. Age. The risk increases with age. Stress. What are the signs or symptoms? High blood pressure may not cause symptoms. Very high blood pressure (hypertensive crisis) may cause: Headache. Fast or uneven heartbeats (palpitations). Shortness of breath. Nosebleed. Vomiting or feeling like you may vomit (nauseous). Changes in how you see. Very bad chest pain. Feeling dizzy. Seizures. How is this treated? This condition is treated by making healthy lifestyle changes, such as: Eating healthy foods. Exercising more. Drinking less alcohol. Your doctor may prescribe medicine if lifestyle changes do not help enough and if: Your top number is above 130. Your bottom number is above 80. Your personal target blood pressure may vary. Follow these instructions at home: Eating and drinking  If told, follow the DASH eating plan. To follow this plan: Fill one half of your plate at each meal with fruits and vegetables. Fill one fourth of your plate  at each meal with whole grains. Whole grains include whole-wheat pasta, brown rice, and whole-grain bread. Eat or drink low-fat dairy products, such as skim milk or low-fat yogurt. Fill one fourth of your plate at each meal with low-fat (lean) proteins. Low-fat proteins include fish, chicken without skin, eggs, beans, and tofu. Avoid fatty meat, cured and processed meat, or chicken with skin. Avoid pre-made or processed food. Limit the amount of salt in your diet to less than 1,500 mg each day. Do not drink alcohol if: Your doctor tells you not to drink. You are pregnant, may be pregnant, or are planning to become pregnant. If you drink alcohol: Limit how much you have to: 0-1 drink a day for women. 0-2 drinks a day for men. Know how much alcohol is in your drink. In the [place] one drink equals one 12 oz bottle of beer (355 mL), one 5 oz glass of wine (148 mL), or one 1 oz glass of hard liquor (44 mL). Lifestyle  Work with your doctor to stay at a healthy weight or to lose weight. Ask your doctor what the best weight is for you. Get at least 30 minutes of exercise that causes your heart to beat faster (aerobic exercise) most days of the week. This may include walking, swimming, or biking. Get at least 30 minutes of exercise that strengthens your muscles (resistance exercise) at least 3 days a week. This may include lifting weights or doing Pilates. Do not smoke or use any products that contain nicotine or tobacco. If you need help quitting, ask your doctor. Check your blood pressure at home as told by your doctor. Keep all follow-up visits. Medicines Take over-the-counter and prescription medicines  only as told by your doctor. Follow directions carefully. Do not skip doses of blood pressure medicine. The medicine does not work as well if you skip doses. Skipping doses also puts you at risk for problems. Ask your doctor about side effects or reactions to medicines that you should watch  for. Contact a doctor if: You think you are having a reaction to the medicine you are taking. You have headaches that keep coming back. You feel dizzy. You have swelling in your ankles. You have trouble with your vision. Get help right away if: You get a very bad headache. You start to feel mixed up (confused). You feel weak or numb. You feel faint. You have very bad pain in your: Chest. Belly (abdomen). You vomit more than once. You have trouble breathing. These symptoms may be an emergency. Get help right away. Call 911. Do not wait to see if the symptoms will go away. Do not drive yourself to the hospital. Summary Hypertension is another name for high blood pressure. High blood pressure forces your heart to work harder to pump blood. For most people, a normal blood pressure is less than 120/80. Making healthy choices can help lower blood pressure. If your blood pressure does not get lower with healthy choices, you may need to take medicine. This information is not intended to replace advice given to you by your health care provider. Make sure you discuss any questions you have with your health care provider. Document Revised: 12/08/2020 Document Reviewed: 12/08/2020 Elsevier Patient Education  2024 [employer].

## 2024-01-08 NOTE — Assessment & Plan Note (Signed)
 GERD symptoms well-controlled with famotidine . Takes famotidine  as needed. - Continue famotidine  as needed.

## 2024-01-08 NOTE — Progress Notes (Signed)
 I,Jameka J Llittleton, CMA,acting as a neurosurgeon for [employer], NP.,have documented all relevant documentation on the behalf of Bruna Creighton, NP,as directed by  Bruna Creighton, NP while in the presence of Bruna Creighton, NP.  Subjective:  Patient ID: Aaron Mccoy , male    DOB: 1975-07-31 , 48 y.o.   MRN: [id number]  Chief Complaint  Patient presents with   Hypertension    Patient presents today for a bpc and abnormal glucose check. Patient reports compliance with his meds. Patient denies having chest pain,sob or headaches at this time. Patient reports he has been feeling more tired than usual.     HPI Discussed the use of AI scribe software for clinical note transcription with the patient, who gave verbal consent to proceed.  History of Present Illness    Aaron Mccoy is a 48 year old male with hypertension, hyperlipidemia, and atrial fibrillation who presents for blood pressure and cholesterol management.  He was placed on a Zio patch for two weeks by his cardiologist, and the results showed episodes of atrial fibrillation according to the patient's recollection. He was subsequently started on diltiazem  (Cardizem ) extended release 240 mg.  He experiences muscle spasms, which he associates with changes in his blood pressure medication. He is unsure if the dose was increased or decreased but recalls a change to 240 mg. He has been taking hydrochlorothiazide  12.5 mg, which he reports difficulty obtaining consistently from the pharmacy.  He is currently taking medication for cholesterol management and reports adherence to his regimen. He also takes famotidine  (Pepcid ) twice daily for acid reflux, which has improved his symptoms.  He reports a weight gain of approximately 8-10 pounds since July, with his current weight at 227 pounds. He notes a preference for fried foods and a lack of vegetable intake.  He takes a multivitamin daily but still feels sluggish and tired despite getting 8-9 hours  of sleep per night.      Past Medical History:  Diagnosis Date   A-fib (HCC)    Hypertension    Irregular heartbeat      Family History  Problem Relation Age of Onset   Hearing loss Mother    Colon cancer Neg Hx    Rectal cancer Neg Hx    Stomach cancer Neg Hx      Current Outpatient Medications:    acetaminophen  (TYLENOL ) 325 MG tablet, Take 1 tablet by mouth every 6 (six) hours as needed., Disp: , Rfl:    diltiazem  (CARDIZEM  CD) 240 MG 24 hr capsule, Take 1 capsule (240 mg total) by mouth daily., Disp: 90 capsule, Rfl: 3   methocarbamol  (ROBAXIN ) 500 MG tablet, Take 1 tablet (500 mg total) by mouth every 8 (eight) hours as needed for muscle spasms., Disp: 90 tablet, Rfl: 1   atorvastatin  (LIPITOR) 20 MG tablet, Take 1 tablet (20 mg total) by mouth daily., Disp: 90 tablet, Rfl: 3   famotidine  (PEPCID ) 20 MG tablet, Take 1 tablet (20 mg total) by mouth 2 (two) times daily., Disp: 180 tablet, Rfl: 2   hydrochlorothiazide  (HYDRODIURIL ) 12.5 MG tablet, Take 1 tablet (12.5 mg total) by mouth daily., Disp: 90 tablet, Rfl: 2   No Known Allergies   Review of Systems  Constitutional: Negative.   Eyes: Negative.   Musculoskeletal: Negative.   Skin: Negative.   Neurological: Negative.   Psychiatric/Behavioral: Negative.       Today's Vitals   01/08/24 0840  BP: 120/76  Pulse: 84  Temp: 98.3 F (  36.8 C)  TempSrc: Oral  Weight: 227 lb (103 kg)  Height: 5' 8 (1.727 m)  PainSc: 0-No pain   Body mass index is 34.52 kg/m.  Wt Readings from Last 3 Encounters:  01/08/24 227 lb (103 kg)  12/09/23 225 lb (102.1 kg)  10/28/23 220 lb (99.8 kg)    The ASCVD Risk score (Arnett DK, et al., 2019) failed to calculate for the following reasons:   The valid total cholesterol range is 130 to 320 mg/dL  Objective:  Physical Exam Constitutional:      Appearance: Normal appearance.  Cardiovascular:     Rate and Rhythm: Normal rate and regular rhythm.     Pulses: Normal pulses.      Heart sounds: Normal heart sounds.  Pulmonary:     Effort: Pulmonary effort is normal.     Breath sounds: Normal breath sounds.  Abdominal:     General: Bowel sounds are normal.  Skin:    General: Skin is warm and dry.  Neurological:     Mental Status: He is alert and oriented to person, place, and time. Mental status is at baseline.         Assessment And Plan:  Essential hypertension Assessment & Plan: -Hypertension managed with Cardizem  and hydrochlorothiazide .  -Recent medication adjustment by cardiologist.  Orders: -     hydroCHLOROthiazide ; Take 1 tablet (12.5 mg total) by mouth daily.  Dispense: 90 tablet; Refill: 2 -     Basic metabolic panel with GFR; Future -     CBC with Differential/Platelet; Future  Paroxysmal atrial fibrillation (HCC) Assessment & Plan: -Diagnosed via Zio patch. Managed with Cardizem . Echocardiogram scheduled to assess heart function. - Continue Cardizem  extended release 240 mg daily. - Perform echocardiogram on November 18th.   Hyperlipidemia due to dietary fat intake -     Atorvastatin  Calcium ; Take 1 tablet (20 mg total) by mouth daily.  Dispense: 90 tablet; Refill: 3 -     Lipid panel; Future  Abnormal glucose Assessment & Plan: A1c is 6.1, indicating prediabetes. Weight gain noted. Dietary habits contribute to elevated A1c. - Encouraged dietary modifications to reduce refined carbohydrates and increase fruits and vegetables. - Advised on portion control and balanced meals with lean proteins and vegetables.  Orders: -     Hemoglobin A1c; Future  Influenza vaccination declined  Gastroesophageal reflux disease, unspecified whether esophagitis present Assessment & Plan: GERD symptoms well-controlled with famotidine . Takes famotidine  as needed. - Continue famotidine  as needed.  Orders: -     Famotidine ; Take 1 tablet (20 mg total) by mouth 2 (two) times daily.  Dispense: 180 tablet; Refill: 2  Need for hepatitis B screening test -      Hepatitis B surface antibody,qualitative  Vitamin B12 deficiency -     Vitamin B12  Class 1 obesity due to excess calories with serious comorbidity and body mass index (BMI) of 34.0 to 34.9 in adult Assessment & Plan: Weight gain noted. Dietary habits contribute to weight gain. - Encouraged dietary modifications to reduce fried foods and refined carbohydrates. - Advised on portion control and balanced meals with lean proteins and vegetables.    Assessment & Plan Essential hypertension  Blood pressure control crucial to prevent complications. - Continue Cardizem  extended release 240 mg daily. - Continue hydrochlorothiazide  12.5 mg daily. - Sent prescription for hydrochlorothiazide  to pharmacy.  Paroxysmal atrial fibrillation Diagnosed via Zio patch. Managed with Cardizem . Echocardiogram scheduled to assess heart function. - Continue Cardizem  extended release 240 mg daily. -  Perform echocardiogram on November 18th.  Hyperlipidemia Managed with cholesterol medication. - Continue current cholesterol medication.  Prediabetes A1c is 6.1, indicating prediabetes. Weight gain noted. Dietary habits contribute to elevated A1c. - Encouraged dietary modifications to reduce refined carbohydrates and increase fruits and vegetables. - Advised on portion control and balanced meals with lean proteins and vegetables.  Gastroesophageal reflux disease (GERD) GERD symptoms well-controlled with famotidine . Takes famotidine  as needed. - Continue famotidine  as needed. - Sent prescription for famotidine  with a 90-day supply.  Obesity, class 1 Weight gain noted. Dietary habits contribute to weight gain. - Encouraged dietary modifications to reduce fried foods and refined carbohydrates. - Advised on portion control and balanced meals with lean proteins and vegetables.  Vitamin B deficiency, unspecified Reports feeling sluggish and tired despite taking a multivitamin. Sleep duration adequate. -  Checked vitamin B12 levels.    Return in about 4 months (around 05/07/2024) for bpc.  Patient was given opportunity to ask questions. Patient verbalized understanding of the plan and was able to repeat key elements of the plan. All questions were answered to their satisfaction.    I, Bruna Creighton, NP, have reviewed all documentation for this visit. The documentation on 01/08/24 for the exam, diagnosis, procedures, and orders are all accurate and complete.   IF YOU HAVE BEEN REFERRED TO A SPECIALIST, IT MAY TAKE 1-2 WEEKS TO SCHEDULE/PROCESS THE REFERRAL. IF YOU HAVE NOT HEARD FROM US /SPECIALIST IN TWO WEEKS, PLEASE GIVE US  A CALL AT [phone] X [phone].

## 2024-01-09 LAB — HEPATITIS B SURFACE ANTIBODY,QUALITATIVE: Hep B Surface Ab, Qual: NONREACTIVE

## 2024-01-09 LAB — VITAMIN B12: Vitamin B-12: 1146 pg/mL (ref 232–1245)

## 2024-01-15 ENCOUNTER — Other Ambulatory Visit (INDEPENDENT_AMBULATORY_CARE_PROVIDER_SITE_OTHER): Payer: Self-pay | Admitting: Primary Care

## 2024-01-15 ENCOUNTER — Other Ambulatory Visit: Payer: Self-pay | Admitting: Family Medicine

## 2024-01-15 DIAGNOSIS — I10 Essential (primary) hypertension: Secondary | ICD-10-CM

## 2024-01-21 ENCOUNTER — Ambulatory Visit (HOSPITAL_COMMUNITY): Payer: Self-pay | Attending: Cardiology

## 2024-01-21 ENCOUNTER — Telehealth (HOSPITAL_COMMUNITY): Payer: Self-pay | Admitting: Cardiology

## 2024-01-21 NOTE — Telephone Encounter (Signed)
 Patient NO SHOWED scheduled echocardiogram on 01/21/24.  We will not reach out to reschedule due to NO SHOW RATE of 13% . Order will be removed from the active echo WQ and if patient calls back to reschedule we will reinstate the order.

## 2024-05-07 ENCOUNTER — Ambulatory Visit: Admitting: Family Medicine

## 2024-09-16 ENCOUNTER — Encounter: Admitting: Family Medicine

## 2024-09-16 ENCOUNTER — Encounter: Payer: Self-pay | Admitting: Family Medicine
